# Patient Record
Sex: Male | Born: 1955 | Race: White | Hispanic: No | Marital: Married | Smoking: Never smoker
Health system: Southern US, Community
[De-identification: ages and names within clinical notes are randomized; demographics above are authoritative.]

## PROBLEM LIST (undated history)

## (undated) DIAGNOSIS — K580 Irritable bowel syndrome with diarrhea: Principal | ICD-10-CM

## (undated) DIAGNOSIS — F3340 Major depressive disorder, recurrent, in remission, unspecified: Secondary | ICD-10-CM

## (undated) DIAGNOSIS — E782 Mixed hyperlipidemia: Secondary | ICD-10-CM

## (undated) DIAGNOSIS — E785 Hyperlipidemia, unspecified: Secondary | ICD-10-CM

## (undated) DIAGNOSIS — N410 Acute prostatitis: Secondary | ICD-10-CM

## (undated) DIAGNOSIS — N401 Enlarged prostate with lower urinary tract symptoms: Secondary | ICD-10-CM

## (undated) DIAGNOSIS — Z79899 Other long term (current) drug therapy: Principal | ICD-10-CM

## (undated) DIAGNOSIS — R350 Frequency of micturition: Secondary | ICD-10-CM

## (undated) DIAGNOSIS — I2510 Atherosclerotic heart disease of native coronary artery without angina pectoris: Principal | ICD-10-CM

## (undated) DIAGNOSIS — M109 Gout, unspecified: Secondary | ICD-10-CM

## (undated) DIAGNOSIS — I493 Ventricular premature depolarization: Secondary | ICD-10-CM

## (undated) HISTORY — DX: Hyperlipidemia, unspecified: E78.5

## (undated) HISTORY — DX: Ventricular premature depolarization: I49.3

## (undated) HISTORY — PX: MOHS SURGERY: SUR867

## (undated) HISTORY — DX: Atherosclerotic heart disease of native coronary artery without angina pectoris: I25.10

---

## 2011-07-31 ENCOUNTER — Emergency Department (HOSPITAL_COMMUNITY): Payer: PRIVATE HEALTH INSURANCE

## 2011-07-31 ENCOUNTER — Emergency Department (HOSPITAL_COMMUNITY)
Admission: EM | Admit: 2011-07-31 | Discharge: 2011-07-31 | Disposition: A | Discharge status: Home / Self Care | Payer: PRIVATE HEALTH INSURANCE | Attending: Emergency Medicine | Admitting: Emergency Medicine

## 2011-07-31 DIAGNOSIS — R002 Palpitations: Secondary | ICD-10-CM | POA: Insufficient documentation

## 2011-07-31 DIAGNOSIS — Z79899 Other long term (current) drug therapy: Secondary | ICD-10-CM | POA: Insufficient documentation

## 2011-07-31 DIAGNOSIS — I4949 Other premature depolarization: Secondary | ICD-10-CM | POA: Insufficient documentation

## 2011-07-31 DIAGNOSIS — R030 Elevated blood-pressure reading, without diagnosis of hypertension: Secondary | ICD-10-CM | POA: Insufficient documentation

## 2011-07-31 DIAGNOSIS — E7800 Pure hypercholesterolemia, unspecified: Secondary | ICD-10-CM | POA: Insufficient documentation

## 2011-07-31 LAB — COMPREHENSIVE METABOLIC PANEL
BUN: 18 mg/dL (ref 6–23)
CO2: 28 mEq/L (ref 19–32)
Chloride: 100 mEq/L (ref 96–112)
Creatinine, Ser: 1.25 mg/dL (ref 0.50–1.35)
GFR calc non Af Amer: 65 mL/min — ABNORMAL LOW (ref >90)
Total Bilirubin: 0.3 mg/dL (ref 0.3–1.2)

## 2011-07-31 LAB — POCT I-STAT TROPONIN I: Troponin i, poc: 0 ng/mL (ref 0.00–0.08)

## 2011-07-31 LAB — CBC
HCT: 43.2 % (ref 39.0–52.0)
Hemoglobin: 15.2 g/dL (ref 13.0–17.0)
MCH: 32.5 pg (ref 26.0–34.0)
MCHC: 35.2 g/dL (ref 30.0–36.0)
RDW: 12.9 % (ref 11.5–15.5)

## 2013-06-22 DIAGNOSIS — I2510 Atherosclerotic heart disease of native coronary artery without angina pectoris: Secondary | ICD-10-CM | POA: Insufficient documentation

## 2013-06-22 HISTORY — PX: CORONARY ANGIOPLASTY WITH STENT PLACEMENT: SHX49

## 2013-06-22 HISTORY — PX: CARDIAC CATHETERIZATION: SHX172

## 2013-06-22 HISTORY — DX: Atherosclerotic heart disease of native coronary artery without angina pectoris: I25.10

## 2015-12-07 ENCOUNTER — Encounter: Payer: Self-pay | Admitting: Internal Medicine

## 2015-12-07 ENCOUNTER — Ambulatory Visit (INDEPENDENT_AMBULATORY_CARE_PROVIDER_SITE_OTHER): Payer: PRIVATE HEALTH INSURANCE | Admitting: Internal Medicine

## 2015-12-07 VITALS — BP 120/82 | HR 64 | Ht 72.0 in | Wt 199.0 lb

## 2015-12-07 DIAGNOSIS — I493 Ventricular premature depolarization: Secondary | ICD-10-CM | POA: Diagnosis not present

## 2015-12-07 DIAGNOSIS — M542 Cervicalgia: Secondary | ICD-10-CM

## 2015-12-07 DIAGNOSIS — I2510 Atherosclerotic heart disease of native coronary artery without angina pectoris: Secondary | ICD-10-CM

## 2015-12-07 DIAGNOSIS — R0789 Other chest pain: Secondary | ICD-10-CM

## 2015-12-07 NOTE — Progress Notes (Signed)
ELECTROPHYSIOLOGY CONSULT NOTE  Patient ID: Paul Garner, MRN: [id number], DOB/AGE: 58-Feb-1957 58 y.o. Admit date: (Not on file) Date of Consult: 12/07/2015  Primary Physician: No primary care provider on file. Primary Cardiologist: [facility]ician non  Chief Complaint: establish   HPI Paul Garner is a 60 y.o. male  Is seen to establish cardiac care.  He has 2 specific issues. The first is coronary artery disease for which she underwent stenting 2014 when he presented with discomfort in his neck. Exertional back discomfort has not been seen since.  Notably, he also has a history of dyslipidemia; this includes hypertriglyceridemia. He has been managed with a combination of Crestor and 70 at, the latter of which she has discontinued because of going to the bathroom.  He has arthritis and discontinued antiplatelet therapy in favor of diclofenac  He also has a history of PVCs. These are variably present; they seem to be worse with exertion. They are associated with a fair amount of anxiety although not a lot of symptoms. He notes them more frequently when he is stressed or exerting himself.    Past Medical History  Diagnosis Date  . Hyperlipidemia   . PVC's (premature ventricular contractions)   . Coronary artery disease 06/22/2013    Stent placement to the Distal RCA & the mid RCA      Surgical History:  Past Surgical History  Procedure Laterality Date  . Cardiac catheterization  06/22/2013  . Coronary angioplasty with stent placement  06/22/2013    stent placement in the distal RCA & mid RCA     Home Meds: Prior to Admission medications   Medication Sig Start Date End Date Taking? Authorizing Provider  diclofenac (VOLTAREN) 75 MG EC tablet Take 75 mg by mouth 2 (two) times daily.    Yes Historical Provider, MD  rosuvastatin (CRESTOR) 20 MG tablet Take 20 mg by mouth daily.    Yes Historical Provider, MD  ULORIC 80 MG TABS 80 mg daily.  10/08/15   Yes Historical Provider, MD    Allergies: No Known Allergies  Social History   Social History  . Marital Status: Married    Spouse Name: N/A  . Number of Children: N/A  . Years of Education: N/A   Occupational History  . Not on file.   Social History Main Topics  . Smoking status: Never Smoker   . Smokeless tobacco: Not on file  . Alcohol Use: 1.2 oz/week    1 Glasses of wine, 1 Cans of beer per week  . Drug Use: No  . Sexual Activity: Not on file   Other Topics Concern  . Not on file   Social History Narrative  . No narrative on file     Family History  Problem Relation Age of Onset  . Heart disease Father     CABG x 2   . Heart attack Father 96  . Hyperlipidemia Father       ROS:  Please see the history of present illness.    All other systems reviewed and negative.    Physical Exam   Blood pressure 120/82, pulse 64, height 6' (1.829 m), weight 199 lb (90.266 kg). General: Well developed, well nourished male in no acute distress. Head: Normocephalic, atraumatic, sclera non-icteric, no xanthomas, nares are without discharge. EENT: normal  Lymph Nodes:  none Neck: Negative for carotid bruits. JVD not elevated. Back:without scoliosis kyphosis*  Lungs: Clear bilaterally to auscultation without wheezes, rales,  or rhonchi. Breathing is unlabored. Heart: RRR with S1 S2. No * murmur . No rubs, or gallops appreciated. Abdomen: Soft, non-tender, non-distended with normoactive bowel sounds. No hepatomegaly. No rebound/guarding. No obvious abdominal masses. Msk:  Strength and tone appear normal for age. Extremities: No clubbing or cyanosis. No  * edema.  Distal pedal pulses are 2+ and equal bilaterally. Skin: Warm and Dry Neuro: Alert and oriented X 3. CN III-XII intact Grossly normal sensory and motor function . Psych:  Responds to questions appropriately with a normal affect.      Labs: Cardiac Enzymes No results for input(s): CKTOTAL, CKMB, TROPONINI in the  last 72 hours. CBC Lab Results  Component Value Date   WBC 6.2 07/31/2011   HGB 15.2 07/31/2011   HCT 43.2 07/31/2011   MCV 92.3 07/31/2011   PLT 273 07/31/2011   PROTIME: No results for input(s): LABPROT, INR in the last 72 hours. Chemistry No results for input(s): NA, K, CL, CO2, BUN, CREATININE, CALCIUM, PROT, BILITOT, ALKPHOS, ALT, AST, GLUCOSE in the last 168 hours.  Invalid input(s): LABALBU Lipids No results found for: CHOL, HDL, LDLCALC, TRIG BNP No results found for: PROBNP Thyroid Function Tests: No results for input(s): TSH, T4TOTAL, T3FREE, THYROIDAB in the last 72 hours.  Invalid input(s): FREET3 Miscellaneous No results found for: DDIMER  Radiology/Studies:  No results found.  EKG nsr at 64 Intervals 14/08/39  Assessment and Plan:  Ischemic heart disease with prior stenting DES RCA 2  Arthritis on diclofenac  Dyslipidemia on Crestor  PVC  Neck discomfort  He is having some neck discomfort. While it is somewhat dissimilar from his angina, we will undertake stress testing. Also thinks that his PVCs are worse with exercise, this will give [place] an opportunity to assess PVC burden response to exercise.  We will also undertake a 24/48 hour Holter monitor to quantitate PVCs to assess potential for negative impact on heart performance    . A couple of issues are raised by his medical regime. The first is the absence of antiplatelet therapy apart from his diclofenac. The second is the cox2 preference of his NSAID. The third is the appropriateness of his anti-lipid therapy. The fact that he was previously on Zetia and is only on modest dose Crestor suggests Augmented therapy may be of benefit. With his history of hypertriglyceridemia, we will get a fasting lipid profile.             Virl Axe

## 2015-12-07 NOTE — Patient Instructions (Addendum)
Medication Instructions: 1) Start aspirin 81 mg one tablet by mouth once daily  Labwork: - Your physician recommends that you return for a FASTING lipid profile: either the day of your stress test (drawn at hospital lab).  Procedures/Testing: - Your physician has recommended that you wear a 48 hour holter monitor. Holter monitors are medical devices that record the heart's electrical activity. Doctors most often use these monitors to diagnose arrhythmias. Arrhythmias are problems with the speed or rhythm of the heartbeat. The monitor is a small, portable device. You can wear one while you do your normal daily activities. This is usually used to diagnose what is causing palpitations/syncope (passing out).  - Your physician has requested that you have an exercise stress myoview. For further information please visit [email or web address]. Please follow instruction sheet, as given.  [place]  Your caregiver has ordered a Stress Test with nuclear imaging. The purpose of this test is to evaluate the blood supply to your heart muscle. This procedure is referred to as a "Non-Invasive Stress Test." This is because other than having an IV started in your vein, nothing is inserted or "invades" your body. Cardiac stress tests are done to find areas of poor blood flow to the heart by determining the extent of coronary artery disease (CAD). Some patients exercise on a treadmill, which naturally increases the blood flow to your heart, while others who are  unable to walk on a treadmill due to physical limitations have a pharmacologic/chemical stress agent called Lexiscan . This medicine will mimic walking on a treadmill by temporarily increasing your coronary blood flow.   Please note: these test may take anywhere between 2-4 hours to complete  PLEASE REPORT TO [facility] AT THE FIRST DESK WILL DIRECT YOU WHERE TO GO  Date of Procedure:_______Tuesday  2/14/17______________________________  Arrival Time for Procedure:__________7:45 am____________________  Instructions regarding medication:   ____ : Hold diabetes medication morning of procedure  ____:  Hold betablocker(s) night before procedure and morning of procedure  ____:  Hold other medications as follows:  _x___: You may take all of your medications the morning of your procedure with enough water to get them down safely   PLEASE NOTIFY THE OFFICE AT LEAST 24 HOURS IN ADVANCE IF YOU ARE UNABLE TO [place].  [phone] AND  PLEASE NOTIFY NUCLEAR MEDICINE AT [facility] AT LEAST 24 HOURS IN ADVANCE IF YOU ARE UNABLE TO KEEP YOUR APPOINTMENT. [phone]  How to prepare for your Myoview test:  1. Do not eat or drink after midnight 2. No caffeine for 24 hours prior to test 3. No smoking 24 hours prior to test. 4. Your medication may be taken with water.  If your doctor stopped a medication because of this test, do not take that medication. 5. Ladies, please do not wear dresses.  Skirts or pants are appropriate. Please wear a short sleeve shirt. 6. No perfume, cologne or lotion. 7. Wear comfortable walking shoes. No heels!   Follow-Up: - Your physician recommends that you schedule a follow-up appointment in: 12 weeks with Christell Faith, PA.  - Your physician wants you to follow-up in: 6 months with Dr. Fletcher Anon (establish general cardiology care). You will receive a reminder letter in the mail two months in advance. If you don't receive a letter, please call our office to schedule the follow-up appointment.  Any Additional Special Instructions Will Be Listed Below (If Applicable).     If you need a refill on your  cardiac medications before your next appointment, please call your pharmacy.

## 2015-12-12 ENCOUNTER — Other Ambulatory Visit
Admission: RE | Admit: 2015-12-12 | Discharge: 2015-12-12 | Disposition: A | Discharge status: Home / Self Care | Payer: 59 | Source: Ambulatory Visit | Attending: Internal Medicine | Admitting: Internal Medicine

## 2015-12-12 ENCOUNTER — Encounter
Admission: RE | Admit: 2015-12-12 | Discharge: 2015-12-12 | Disposition: A | Discharge status: Home / Self Care | Payer: 59 | Source: Ambulatory Visit | Attending: Internal Medicine | Admitting: Internal Medicine

## 2015-12-12 DIAGNOSIS — M542 Cervicalgia: Secondary | ICD-10-CM | POA: Insufficient documentation

## 2015-12-12 DIAGNOSIS — R0789 Other chest pain: Secondary | ICD-10-CM | POA: Diagnosis present

## 2015-12-12 DIAGNOSIS — I2510 Atherosclerotic heart disease of native coronary artery without angina pectoris: Secondary | ICD-10-CM | POA: Diagnosis present

## 2015-12-12 LAB — NM MYOCAR MULTI W/SPECT W/WALL MOTION / EF
CHL CUP NUCLEAR SDS: 0
CHL CUP NUCLEAR SRS: 1
CHL CUP NUCLEAR SSS: 0
CSEPEW: 10.1 METS
CSEPHR: 85 %
Exercise duration (min): 10 min
LV sys vol: 33 mL
LVDIAVOL: 82 mL
Peak HR: 139 {beats}/min
Rest HR: 69 {beats}/min
TID: 0.8

## 2015-12-12 LAB — LIPID PANEL
Cholesterol: 137 mg/dL (ref 0–200)
HDL: 41 mg/dL (ref >40)
LDL Cholesterol: 67 mg/dL (ref 0–99)
Total CHOL/HDL Ratio: 3.3 RATIO
Triglycerides: 144 mg/dL (ref <150)
VLDL: 29 mg/dL (ref 0–40)

## 2015-12-12 MED ORDER — TECHNETIUM TC 99M SESTAMIBI - CARDIOLITE
14.1600 | Freq: Once | INTRAVENOUS | Status: AC | PRN
  Administered 2015-12-12: 14.16 via INTRAVENOUS

## 2015-12-12 MED ORDER — TECHNETIUM TC 99M SESTAMIBI - CARDIOLITE
32.0890 | Freq: Once | INTRAVENOUS | Status: AC | PRN
  Administered 2015-12-12: 32.089 via INTRAVENOUS

## 2015-12-18 ENCOUNTER — Ambulatory Visit (INDEPENDENT_AMBULATORY_CARE_PROVIDER_SITE_OTHER): Payer: 59

## 2015-12-18 DIAGNOSIS — I493 Ventricular premature depolarization: Secondary | ICD-10-CM

## 2015-12-21 ENCOUNTER — Other Ambulatory Visit: Payer: Self-pay | Admitting: Respiratory Therapy

## 2015-12-25 ENCOUNTER — Other Ambulatory Visit: Payer: Self-pay | Admitting: Internal Medicine

## 2015-12-25 ENCOUNTER — Ambulatory Visit
Admission: RE | Admit: 2015-12-25 | Discharge: 2015-12-25 | Disposition: A | Discharge status: Home / Self Care | Payer: 59 | Source: Ambulatory Visit | Attending: Internal Medicine | Admitting: Internal Medicine

## 2015-12-25 ENCOUNTER — Ambulatory Visit (HOSPITAL_COMMUNITY)
Admission: RE | Admit: 2015-12-25 | Discharge: 2015-12-25 | Disposition: A | Discharge status: Home / Self Care | Payer: 59 | Source: Ambulatory Visit | Attending: Internal Medicine | Admitting: Internal Medicine

## 2015-12-25 DIAGNOSIS — R0789 Other chest pain: Secondary | ICD-10-CM | POA: Insufficient documentation

## 2015-12-25 DIAGNOSIS — I493 Ventricular premature depolarization: Secondary | ICD-10-CM

## 2016-01-03 ENCOUNTER — Ambulatory Visit: Payer: PRIVATE HEALTH INSURANCE | Admitting: Cardiology

## 2016-03-07 ENCOUNTER — Ambulatory Visit (INDEPENDENT_AMBULATORY_CARE_PROVIDER_SITE_OTHER): Payer: 59 | Admitting: Cardiovascular Disease

## 2016-03-07 ENCOUNTER — Encounter: Payer: Self-pay | Admitting: Cardiovascular Disease

## 2016-03-07 VITALS — BP 110/64 | HR 81 | Ht 72.0 in | Wt 197.8 lb

## 2016-03-07 DIAGNOSIS — E785 Hyperlipidemia, unspecified: Secondary | ICD-10-CM

## 2016-03-07 DIAGNOSIS — I493 Ventricular premature depolarization: Secondary | ICD-10-CM | POA: Insufficient documentation

## 2016-03-07 MED ORDER — ROSUVASTATIN CALCIUM 20 MG PO TABS: 20.0000 mg | ORAL_TABLET | Freq: Every day | ORAL | Status: DC

## 2016-03-07 NOTE — Patient Instructions (Addendum)
Medication Instructions:  Your physician recommends that you continue on your current medications as directed. Please refer to the Current Medication list given to you today.   Labwork: Fasting lipid and liver at your 6 months office visit.   Testing/Procedures: none  Follow-Up: Your physician wants you to follow-up in: six months with Dr. Fletcher Anon.  You will receive a reminder letter in the mail two months in advance. If you don't receive a letter, please call our office to schedule the follow-up appointment.   Any Other Special Instructions Will Be Listed Below (If Applicable).     If you need a refill on your cardiac medications before your next appointment, please call your pharmacy.

## 2016-03-07 NOTE — Progress Notes (Signed)
Cardiology Office Note   Date:  03/07/2016   ID:  Paul Garner, DOB [id number], MRN [id number]  PCP:  No PCP Per Patient  Cardiologist:   Kathlyn Sacramento, MD   Chief Complaint  Patient presents with  . other    Per [name]. general cardiologist. Meds reviewed verbally.      History of Present Illness: Paul Garner is a 60 y.o. male who is here today to establish cardiovascular care. He is in the process of moving from [facility]. He has known history of coronary artery disease with previous stenting of the right coronary artery in 2014 for stable angina. No previous myocardial infarction. Other medical problems include PVCs, hyperlipidemia and gout. He is a lifelong nonsmoker. He has extensive family history of coronary artery disease. He was seen by Dr. Caryl Comes for PVCs. He had a nuclear stress test which showed no evidence of ischemia with normal ejection fraction. Holter monitor showed 1700 PVCs representing 1%. He has been doing well overall with no recurrent chest pain or shortness of breath. He occasionally gets PVCs which are completely unpredictable. They do not seem to be related to stress or caffeine intake.    Past Medical History  Diagnosis Date  . PVC's (premature ventricular contractions)   . Coronary artery disease 06/22/2013    Stent placement to the Distal RCA & the mid RCA  . Hyperlipidemia     Past Surgical History  Procedure Laterality Date  . Cardiac catheterization  06/22/2013  . Coronary angioplasty with stent placement  06/22/2013    stent placement in the distal RCA & mid RCA     Current Outpatient Prescriptions  Medication Sig Dispense Refill  . aspirin EC 81 MG tablet Take 1 tablet (81 mg total) by mouth daily.    . diclofenac (VOLTAREN) 75 MG EC tablet Take 75 mg by mouth 2 (two) times daily.     . rosuvastatin (CRESTOR) 20 MG tablet Take 1 tablet (20 mg total) by mouth daily. 30 tablet 6  . ULORIC 80 MG TABS 80 mg  daily.      No current facility-administered medications for this visit.    Allergies:   Review of patient's allergies indicates no known allergies.    Social History:  The patient  reports that he has never smoked. He does not have any smokeless tobacco history on file. He reports that he drinks about 1.2 oz of alcohol per week. He reports that he does not use illicit drugs.   Family History:  The patient's family history includes Heart attack (age of onset: 61) in his father; Heart disease in his father; Hyperlipidemia in his father.    ROS:  Please see the history of present illness.   Otherwise, review of systems are positive for none.   All other systems are reviewed and negative.    PHYSICAL EXAM: VS:  BP 110/64 mmHg  Pulse 81  Ht 6' (1.829 m)  Wt 197 lb 12 oz (89.699 kg)  BMI 26.81 kg/m2 , BMI Body mass index is 26.81 kg/(m^2). GEN: Well nourished, well developed, in no acute distress HEENT: normal Neck: no JVD, carotid bruits, or masses Cardiac: RRR; no murmurs, rubs, or gallops,no edema  Respiratory:  clear to auscultation bilaterally, normal work of breathing GI: soft, nontender, nondistended, + BS MS: no deformity or atrophy Skin: warm and dry, no rash Neuro:  Strength and sensation are intact Psych: euthymic mood, full affect   EKG:  EKG is not ordered today.   Recent Labs: No results found for requested labs within last 365 days.    Lipid Panel    Component Value Date/Time   CHOL 137 12/12/2015 1007   TRIG 144 12/12/2015 1007   HDL 41 12/12/2015 1007   CHOLHDL 3.3 12/12/2015 1007   VLDL 29 12/12/2015 1007   LDLCALC 67 12/12/2015 1007      Wt Readings from Last 3 Encounters:  03/07/16 197 lb 12 oz (89.699 kg)  12/07/15 199 lb (90.266 kg)        ASSESSMENT AND PLAN:  1.  Coronary artery disease involving native coronary arteries without angina: He is overall doing well with no anginal symptoms. Continue low-dose aspirin. Drug-eluting stent  placement was more than 2 years ago and thus there is no need for dual antiplatelet therapy.  2. Hyperlipidemia: Lipid profile recently was excellent but the patient does not think it was accurate as his cholesterol and triglycerides are usually much higher even on rosuvastatin. I'm planning to repeat in 6 months.  3. PVCs: The burden was not significant enough to require treatment. Continue to monitor symptoms.    Disposition:   FU with me in 6 months  Signed,  Kathlyn Sacramento, MD  03/07/2016 4:20 PM    [facility]

## 2016-03-31 ENCOUNTER — Encounter: Payer: Self-pay | Admitting: *Deleted

## 2016-03-31 ENCOUNTER — Ambulatory Visit
Admission: EM | Admit: 2016-03-31 | Discharge: 2016-03-31 | Disposition: A | Discharge status: Home / Self Care | Payer: 59 | Attending: Family Medicine | Admitting: Family Medicine

## 2016-03-31 DIAGNOSIS — K648 Other hemorrhoids: Secondary | ICD-10-CM | POA: Diagnosis not present

## 2016-03-31 MED ORDER — LIDOCAINE-HYDROCORTISONE ACE 3-1 % RE KIT: 1.0000 "application " | PACK | Freq: Two times a day (BID) | RECTAL | Status: DC

## 2016-03-31 NOTE — ED Provider Notes (Signed)
Discussed with patient's wife and having some low-grade fever and chills since Friday which she did not mention when he was earlier today. Will call in Septra DS 1 tablet twice a day #20 at [facility]. His wife Paul Garner was notified that the medicine will be called in.  Frederich Cha, MD 03/31/16 1725

## 2016-03-31 NOTE — Discharge Instructions (Signed)
You are overdue for colonoscopy-Please schedule ASAP Return if fever, severe pain or bleeding Coloace 100mg  1-2 daily for stool softener  High-Fiber Diet Fiber, also called dietary fiber, is a type of carbohydrate found in fruits, vegetables, whole grains, and beans. A high-fiber diet can have many health benefits. Your health care provider may recommend a high-fiber diet to help:  Prevent constipation. Fiber can make your bowel movements more regular.  Lower your cholesterol.  Relieve hemorrhoids, uncomplicated diverticulosis, or irritable bowel syndrome.  Prevent overeating as part of a weight-loss plan.  Prevent heart disease, type 2 diabetes, and certain cancers. WHAT IS MY PLAN? The recommended daily intake of fiber includes:  38 grams for men under age 17.  7 grams for men over age 32.  69 grams for women under age 42.  13 grams for women over age 48. You can get the recommended daily intake of dietary fiber by eating a variety of fruits, vegetables, grains, and beans. Your health care provider may also recommend a fiber supplement if it is not possible to get enough fiber through your diet. WHAT DO I NEED TO KNOW ABOUT A HIGH-FIBER DIET?  Fiber supplements have not been widely studied for their effectiveness, so it is better to get fiber through food sources.  Always check the fiber content on thenutrition facts label of any prepackaged food. Look for foods that contain at least 5 grams of fiber per serving.  Ask your dietitian if you have questions about specific foods that are related to your condition, especially if those foods are not listed in the following section.  Increase your daily fiber consumption gradually. Increasing your intake of dietary fiber too quickly may cause bloating, cramping, or gas.  Drink plenty of water. Water helps you to digest fiber. WHAT FOODS CAN I EAT? Grains Whole-grain breads. Multigrain cereal. Oats and oatmeal. Brown rice. Barley.  Bulgur wheat. [place]. Bran muffins. Popcorn. Rye wafer crackers. Vegetables Sweet potatoes. Spinach. Kale. Artichokes. Cabbage. Broccoli. Green peas. Carrots. Squash. Fruits Berries. Pears. Apples. Oranges. Avocados. Prunes and raisins. Dried figs. Meats and Other Protein Sources [place], kidney, pinto, and soy beans. Split peas. Lentils. Nuts and seeds. Dairy Fiber-fortified yogurt. Beverages Fiber-fortified soy milk. Fiber-fortified orange juice. Other Fiber bars. The items listed above may not be a complete list of recommended foods or beverages. Contact your dietitian for more options. WHAT FOODS ARE NOT RECOMMENDED? Grains White bread. Pasta made with refined flour. White rice. Vegetables Fried potatoes. Canned vegetables. Well-cooked vegetables.  Fruits Fruit juice. Cooked, strained fruit. Meats and Other Protein Sources Fatty cuts of meat. Fried Sales executive or fried fish. Dairy Milk. Yogurt. Cream cheese. Sour cream. Beverages Soft drinks. Other Cakes and pastries. Butter and oils. The items listed above may not be a complete list of foods and beverages to avoid. Contact your dietitian for more information. WHAT ARE SOME TIPS FOR INCLUDING HIGH-FIBER FOODS IN MY DIET?  Eat a wide variety of high-fiber foods.  Make sure that half of all grains consumed each day are whole grains.  Replace breads and cereals made from refined flour or white flour with whole-grain breads and cereals.  Replace white rice with brown rice, bulgur wheat, or millet.  Start the day with a breakfast that is high in fiber, such as a cereal that contains at least 5 grams of fiber per serving.  Use beans in place of meat in soups, salads, or pasta.  Eat high-fiber snacks, such as berries, raw vegetables, nuts, or popcorn.  This information is not intended to replace advice given to you by your health care provider. Make sure you discuss any questions you have with your health care provider.     Document Released: 10/14/2005 Document Revised: 11/04/2014 Document Reviewed: 03/29/2014 Elsevier Interactive Patient Education 2016 [employer].  Hemorrhoids Hemorrhoids are puffy (swollen) veins around the rectum or anus. Hemorrhoids can cause pain, itching, bleeding, or irritation. HOME CARE  Eat foods with fiber, such as whole grains, beans, nuts, fruits, and vegetables. Ask your doctor about taking products with added fiber in them (fibersupplements).  Drink enough fluid to keep your pee (urine) clear or pale yellow.  Exercise often.  Go to the bathroom when you have the urge to poop. Do not wait.  Avoid straining to poop (bowel movement).  Keep the butt area dry and clean. Use wet toilet paper or moist paper towels.  Medicated creams and medicine inserted into the anus (anal suppository) may be used or applied as told.  Only take medicine as told by your doctor.  Take a warm water bath (sitz bath) for 15-20 minutes to ease pain. Do this 3-4 times a day.  Place ice packs on the area if it is tender or puffy. Use the ice packs between the warm water baths.  Put ice in a plastic bag.  Place a towel between your skin and the bag.  Leave the ice on for 15-20 minutes, 03-04 times a day.  Do not use a donut-shaped pillow or sit on the toilet for a long time. GET HELP RIGHT AWAY IF:   You have more pain that is not controlled by treatment or medicine.  You have bleeding that will not stop.  You have trouble or are unable to poop (bowel movement).  You have pain or puffiness outside the area of the hemorrhoids. MAKE SURE YOU:   Understand these instructions.  Will watch your condition.  Will get help right away if you are not doing well or get worse.   This information is not intended to replace advice given to you by your health care provider. Make sure you discuss any questions you have with your health care provider.   Document Released: 07/23/2008 Document  Revised: 09/30/2012 Document Reviewed: 08/25/2012 Elsevier Interactive Patient Education [employer].

## 2016-03-31 NOTE — ED Provider Notes (Signed)
CSN: [id number]     Arrival date & time 03/31/16  0865 History   First MD Initiated Contact with Patient 03/31/16 [phone]     Chief Complaint  Patient presents with  . Hemorrhoids   HPI Paul Garner is a pleasant 60 y.o. male who presents with anal pain. He states about 3 days ago he noted left-sided anal pain. Pain is 4 out of 10 at worst. He feels he has hemorrhoids and has noticed bulging in that area. He has been trying Preparation H cream and suppositories. He has had little relief with this. He denies any history of constipation diarrhea or straining. 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 His last colonoscopy was over 10 years ago. He denies rectal bleeding. He denies any fever. He is on RN and aspirin daily. He denies any significant hemorrhoid surgeries in the past or treatments but has felt like he has had intermittent hemorrhoids over the years.  Past Medical History  Diagnosis Date  . PVC's (premature ventricular contractions)   . Coronary artery disease 06/22/2013    Stent placement to the Distal RCA & the mid RCA  . Hyperlipidemia    Past Surgical History  Procedure Laterality Date  . Cardiac catheterization  06/22/2013  . Coronary angioplasty with stent placement  06/22/2013    stent placement in the distal RCA & mid RCA   Family History  Problem Relation Age of Onset  . Heart disease Father     CABG x 2   . Heart attack Father 49  . Hyperlipidemia Father    Social History  Substance Use Topics  . Smoking status: Never Smoker   . Smokeless tobacco: None  . Alcohol Use: 1.2 oz/week    1 Glasses of wine, 1 Cans of beer per week    Review of Systems  Constitutional: Negative.   HENT: Negative.   Eyes: Negative.   Respiratory: Negative.   Cardiovascular: Negative.   Gastrointestinal: Positive for rectal pain. Negative for blood in stool and anal bleeding.  Genitourinary: Negative.    Musculoskeletal: Negative.   Skin: Negative.   Neurological: Negative.   Hematological: Negative.   Psychiatric/Behavioral: Negative.     Allergies  Review of patient's allergies indicates no known allergies.  Home Medications   Prior to Admission medications   Medication Sig Start Date End Date Taking? Authorizing Provider  aspirin EC 81 MG tablet Take 1 tablet (81 mg total) by mouth daily. 12/07/15  Yes Deboraha Sprang, MD  diclofenac (VOLTAREN) 75 MG EC tablet Take 75 mg by mouth 2 (two) times daily.    Yes Historical Provider, MD  rosuvastatin (CRESTOR) 20 MG tablet Take 1 tablet (20 mg total) by mouth daily. 03/07/16  Yes Wellington Hampshire, MD  ULORIC 80 MG TABS 80 mg daily.  10/08/15  Yes Historical Provider, MD  lidocaine-hydrocortisone (ANAMANTLE) 3-1 % KIT Place 1 application rectally 2 (two) times daily. 03/31/16   Andria Meuse, NP   Meds Ordered and Administered this Visit  Medications - No data to display  BP 115/76 mmHg  Pulse 65  Temp(Src) 97.9 F (36.6 C) (Oral)  Resp 18  Ht 6' (1.829 m)  Wt 195 lb (88.451 kg)  BMI 26.44 kg/m2  SpO2 100% No data found.   Physical Exam  Constitutional: He is oriented to person, place, and time. He appears well-developed and well-nourished. No distress.  HENT:  Head: Normocephalic and atraumatic.  Eyes: Conjunctivae are normal. No scleral icterus.  Neck: Normal range of motion.  Cardiovascular: Normal rate and regular rhythm.   Pulmonary/Chest: Effort normal and breath sounds normal. No respiratory distress.  Abdominal: Soft. Bowel sounds are normal. He exhibits no distension. There is no hepatosplenomegaly. There is no tenderness. No hernia.  Genitourinary: Prostate normal. Rectal exam shows internal hemorrhoid and tenderness.  Inflamed int hemorrhoid 7-8 o clock.  No gross bleeding.  Musculoskeletal: Normal range of motion. He exhibits no edema or tenderness.  Neurological: He is alert and oriented to person, place, and  time. No cranial nerve deficit.  Skin: Skin is warm and dry. No rash noted. No erythema.  Psychiatric: He has a normal mood and affect. His behavior is normal. Judgment normal.  Nursing note and vitals reviewed.   ED Course  Procedures NA  Labs Review Labs Reviewed - No data to display  MDM   1. Inflamed internal hemorrhoid    Plan: Diagnosis reviewed with patient  Rx as per orders;  benefits, risks, potential side effects reviewed  Recommend supportive treatment with donut, high fiber diet, colace 118m qd or bid prn Seek additional medical care if symptoms worsen or are not improving Call Dr WAllen Norristo make appt for colonoscopy     KAndria Meuse NP 03/31/16 1002

## 2016-03-31 NOTE — ED Notes (Signed)
Patient started having symptoms of minor hemorrhoid pain 1 week ago, and started having severe hemorrhoid pain 2 days ago. Patient does have a history of minor hemorrhoid pain.

## 2016-04-01 ENCOUNTER — Encounter: Payer: Self-pay | Admitting: Emergency Medicine

## 2016-04-01 ENCOUNTER — Encounter
Admission: EM | Disposition: A | Discharge status: Home / Self Care | Payer: Self-pay | Source: Home / Self Care | Attending: Emergency Medicine

## 2016-04-01 ENCOUNTER — Telehealth: Payer: Self-pay

## 2016-04-01 ENCOUNTER — Emergency Department
Admission: EM | Admit: 2016-04-01 | Discharge: 2016-04-01 | Disposition: A | Discharge status: Home / Self Care | Payer: 59 | Attending: Emergency Medicine | Admitting: Emergency Medicine

## 2016-04-01 ENCOUNTER — Emergency Department: Payer: 59 | Admitting: Anesthesiology

## 2016-04-01 DIAGNOSIS — I493 Ventricular premature depolarization: Secondary | ICD-10-CM | POA: Diagnosis not present

## 2016-04-01 DIAGNOSIS — K611 Rectal abscess: Secondary | ICD-10-CM | POA: Insufficient documentation

## 2016-04-01 DIAGNOSIS — Z8249 Family history of ischemic heart disease and other diseases of the circulatory system: Secondary | ICD-10-CM | POA: Insufficient documentation

## 2016-04-01 DIAGNOSIS — E785 Hyperlipidemia, unspecified: Secondary | ICD-10-CM | POA: Insufficient documentation

## 2016-04-01 DIAGNOSIS — I2510 Atherosclerotic heart disease of native coronary artery without angina pectoris: Secondary | ICD-10-CM | POA: Insufficient documentation

## 2016-04-01 DIAGNOSIS — Z955 Presence of coronary angioplasty implant and graft: Secondary | ICD-10-CM | POA: Insufficient documentation

## 2016-04-01 HISTORY — PX: INCISION AND DRAINAGE PERIRECTAL ABSCESS: SHX1804

## 2016-04-01 LAB — CBC WITH DIFFERENTIAL/PLATELET
BASOS ABS: 0.1 10*3/uL (ref 0–0.1)
EOS ABS: 0 10*3/uL (ref 0–0.7)
HCT: 44.6 % (ref 40.0–52.0)
Hemoglobin: 15.1 g/dL (ref 13.0–18.0)
Lymphocytes Relative: 11 %
Lymphs Abs: 1 10*3/uL (ref 1.0–3.6)
MCH: 31 pg (ref 26.0–34.0)
MCHC: 33.7 g/dL (ref 32.0–36.0)
MCV: 91.8 fL (ref 80.0–100.0)
Monocytes Absolute: 0.6 10*3/uL (ref 0.2–1.0)
Monocytes Relative: 6 %
Neutro Abs: 7.8 10*3/uL — ABNORMAL HIGH (ref 1.4–6.5)
Neutrophils Relative %: 82 %
PLATELETS: 195 10*3/uL (ref 150–440)
RBC: 4.86 MIL/uL (ref 4.40–5.90)
RDW: 13.2 % (ref 11.5–14.5)
WBC: 9.6 10*3/uL (ref 3.8–10.6)

## 2016-04-01 SURGERY — INCISION AND DRAINAGE, ABSCESS, PERIRECTAL
Anesthesia: General | Wound class: Dirty or Infected

## 2016-04-01 MED ORDER — ONDANSETRON HCL 4 MG/2ML IJ SOLN
INTRAMUSCULAR | Status: DC | PRN
Start: 2016-04-01 — End: 2016-04-01
  Administered 2016-04-01: 4 mg via INTRAVENOUS

## 2016-04-01 MED ORDER — LACTATED RINGERS IV SOLN
INTRAVENOUS | Status: DC
  Administered 2016-04-01: 14:00:00 via INTRAVENOUS

## 2016-04-01 MED ORDER — ACETAMINOPHEN 10 MG/ML IV SOLN
INTRAVENOUS | Status: DC | PRN
  Administered 2016-04-01: 1000 mg via INTRAVENOUS

## 2016-04-01 MED ORDER — DOXYCYCLINE HYCLATE 100 MG PO CAPS: 100.0000 mg | ORAL_CAPSULE | Freq: Two times a day (BID) | ORAL | Status: DC

## 2016-04-01 MED ORDER — FENTANYL CITRATE (PF) 100 MCG/2ML IJ SOLN
25.0000 ug | INTRAMUSCULAR | Status: AC | PRN
  Administered 2016-04-01 (×6): 25 ug via INTRAVENOUS

## 2016-04-01 MED ORDER — KETAMINE HCL 50 MG/ML IJ SOLN
INTRAMUSCULAR | Status: DC | PRN
  Administered 2016-04-01: 30 mg via INTRAVENOUS

## 2016-04-01 MED ORDER — PROPOFOL 10 MG/ML IV BOLUS
INTRAVENOUS | Status: DC | PRN
  Administered 2016-04-01: 150 mg via INTRAVENOUS

## 2016-04-01 MED ORDER — FENTANYL CITRATE (PF) 100 MCG/2ML IJ SOLN
INTRAMUSCULAR | Status: AC
  Administered 2016-04-01: 25 ug via INTRAVENOUS
  Filled 2016-04-01: qty 2

## 2016-04-01 MED ORDER — OXYCODONE-ACETAMINOPHEN 5-325 MG PO TABS
1.0000 | ORAL_TABLET | ORAL | Status: DC | PRN
  Administered 2016-04-01: 1 via ORAL

## 2016-04-01 MED ORDER — GLYCOPYRROLATE 0.2 MG/ML IJ SOLN
INTRAMUSCULAR | Status: DC | PRN
  Administered 2016-04-01: 0.2 mg via INTRAVENOUS

## 2016-04-01 MED ORDER — OXYCODONE-ACETAMINOPHEN 5-325 MG PO TABS
ORAL_TABLET | ORAL | Status: AC
  Administered 2016-04-01: 1 via ORAL
  Filled 2016-04-01: qty 1

## 2016-04-01 MED ORDER — OXYCODONE-ACETAMINOPHEN 5-325 MG PO TABS: 1.0000 | ORAL_TABLET | ORAL | Status: DC | PRN

## 2016-04-01 MED ORDER — SODIUM CHLORIDE 0.9 % IV SOLN
1.0000 g | INTRAVENOUS | Status: AC
  Administered 2016-04-01: 1 g via INTRAVENOUS
  Filled 2016-04-01: qty 1

## 2016-04-01 MED ORDER — MIDAZOLAM HCL 2 MG/2ML IJ SOLN
INTRAMUSCULAR | Status: DC | PRN
  Administered 2016-04-01: 2 mg via INTRAVENOUS

## 2016-04-01 MED ORDER — FENTANYL CITRATE (PF) 100 MCG/2ML IJ SOLN
INTRAMUSCULAR | Status: DC | PRN
  Administered 2016-04-01: 25 ug via INTRAVENOUS
  Administered 2016-04-01: 50 ug via INTRAVENOUS

## 2016-04-01 MED ORDER — LIDOCAINE HCL (CARDIAC) 20 MG/ML IV SOLN
INTRAVENOUS | Status: DC | PRN
  Administered 2016-04-01: 100 mg via INTRAVENOUS

## 2016-04-01 MED ORDER — ONDANSETRON HCL 4 MG/2ML IJ SOLN: 4.0000 mg | Freq: Once | INTRAMUSCULAR | Status: DC | PRN

## 2016-04-01 SURGICAL SUPPLY — 32 items
BANDAGE ELASTIC 6 CLIP NS LF (GAUZE/BANDAGES/DRESSINGS) IMPLANT
BLADE SURG 15 STRL SS SAFETY (BLADE) ×3 IMPLANT
BNDG GAUZE 4.5X4.1 6PLY STRL (MISCELLANEOUS) IMPLANT
CANISTER SUCT 1200ML W/VALVE (MISCELLANEOUS) ×3 IMPLANT
CHLORAPREP W/TINT 26ML (MISCELLANEOUS) ×3 IMPLANT
CLOSURE WOUND 1/2 X4 (GAUZE/BANDAGES/DRESSINGS)
DRAIN PENROSE 1/4X12 LTX (DRAIN) ×3 IMPLANT
DRAPE LAPAROTOMY 100X77 ABD (DRAPES) ×3 IMPLANT
DRESSING TELFA 4X3 1S ST N-ADH (GAUZE/BANDAGES/DRESSINGS) IMPLANT
DRSG TEGADERM 4X4.75 (GAUZE/BANDAGES/DRESSINGS) IMPLANT
GLOVE BIO SURGEON STRL SZ7 (GLOVE) ×3 IMPLANT
GOWN STRL REUS W/ TWL LRG LVL3 (GOWN DISPOSABLE) ×2 IMPLANT
GOWN STRL REUS W/TWL LRG LVL3 (GOWN DISPOSABLE) ×4
KIT RM TURNOVER STRD PROC AR (KITS) ×3 IMPLANT
LABEL OR SOLS (LABEL) ×3 IMPLANT
NDL SAFETY 22GX1.5 (NEEDLE) ×3 IMPLANT
NDL SAFETY 25GX1.5 (NEEDLE) ×3 IMPLANT
NS IRRIG 1000ML POUR BTL (IV SOLUTION) ×3 IMPLANT
PACK BASIN MINOR ARMC (MISCELLANEOUS) ×3 IMPLANT
PAD GROUND ADULT SPLIT (MISCELLANEOUS) ×3 IMPLANT
SOL PREP PVP 2OZ (MISCELLANEOUS) ×3
SOLUTION PREP PVP 2OZ (MISCELLANEOUS) ×1 IMPLANT
STRIP CLOSURE SKIN 1/2X4 (GAUZE/BANDAGES/DRESSINGS) IMPLANT
SUT ETHILON 3-0 FS-10 30 BLK (SUTURE) ×3
SUT VIC AB 2-0 CT1 27 (SUTURE)
SUT VIC AB 2-0 CT1 TAPERPNT 27 (SUTURE) IMPLANT
SUT VIC AB 2-0 CT2 27 (SUTURE) IMPLANT
SUT VIC AB 4-0 FS2 27 (SUTURE) IMPLANT
SUTURE EHLN 3-0 FS-10 30 BLK (SUTURE) ×1 IMPLANT
SWAB CULTURE AMIES ANAERIB BLU (MISCELLANEOUS) ×3 IMPLANT
SWABSTK COMLB BENZOIN TINCTURE (MISCELLANEOUS) ×3 IMPLANT
SYR CONTROL 10ML (SYRINGE) ×3 IMPLANT

## 2016-04-01 NOTE — Op Note (Signed)
Preop diagnosis: Perirectal abscess  Post op diagnosis: Same located at the 2:00 location  Operation: Drainage of perirectal abscess  Surgeon: Mckinley Jewel   Assistant:     Anesthesia: Gen.  Complications: None  EBL: Less than 5 mL  Drains: None  Description: Patient was put to sleep with an LMA and then placed the lithotomy position on the operating table. Anal area was prepped and draped as sterile field and timeout performed.. Evaluation of the rectal area showed a abscess at the running alongside the rectum starting around the 2:00 location and going up approximately 5-6 cm. At the 2:00 location just outside the anal opening a cruciate incision was made and the skin excised along the corners to allow for adequate drainage. A Kelly clamp was introduced with a finger in the rectum alongside the rectal area to open up a large pocket of pus containing thick yellowish-white the pus with some odor. Approximately 10 mL to 15 mL pus was drained. Suction was used to aspirate out completely from the abscess cavity. The opening was adequate for continued drainage. Area was covered with some ABDs and a mesh panty. Patient subsequently extubated and returned recovery room in stable condition.

## 2016-04-01 NOTE — Anesthesia Procedure Notes (Signed)
Procedure Name: LMA Insertion Date/Time: 04/01/2016 2:43 PM Performed by: Rosaria Ferries, Leldon Steege Pre-anesthesia Checklist: Patient identified, Emergency Drugs available, Suction available and Patient being monitored Patient Re-evaluated:Patient Re-evaluated prior to inductionOxygen Delivery Method: Circle system utilized Preoxygenation: Pre-oxygenation with 100% oxygen Intubation Type: IV induction LMA: LMA inserted LMA Size: 5.0 Number of attempts: 1 Tube secured with: Tape Dental Injury: Teeth and Oropharynx as per pre-operative assessment

## 2016-04-01 NOTE — Anesthesia Preprocedure Evaluation (Addendum)
Anesthesia Evaluation  Patient identified by MRN, date of birth, ID band Patient awake    Reviewed: Allergy & Precautions, NPO status , Patient's Chart, lab work & pertinent test results, reviewed documented beta blocker date and time   Airway Mallampati: II  TM Distance: >3 FB     Dental  (+) Chipped   Pulmonary    Pulmonary exam normal        Cardiovascular + CAD and + Cardiac Stents  Normal cardiovascular exam     Neuro/Psych    GI/Hepatic   Endo/Other    Renal/GU      Musculoskeletal   Abdominal Normal abdominal exam  (+)   Peds  Hematology   Anesthesia Other Findings Hx of PVCs. Holter done, PVCs not significant.  Reproductive/Obstetrics                           Anesthesia Physical Anesthesia Plan  ASA: III  Anesthesia Plan: General   Post-op Pain Management:    Induction: Intravenous  Airway Management Planned: LMA  Additional Equipment:   Intra-op Plan:   Post-operative Plan: Extubation in OR  Informed Consent: I have reviewed the patients History and Physical, chart, labs and discussed the procedure including the risks, benefits and alternatives for the proposed anesthesia with the patient or authorized representative who has indicated his/her understanding and acceptance.     Plan Discussed with: CRNA and Surgeon  Anesthesia Plan Comments:        Anesthesia Quick Evaluation

## 2016-04-01 NOTE — H&P (Signed)
Paul Garner is an 60 y.o. male.   Chief Complaint:pain in rectal area HPI: 60 yr old male presents with 3-4 day history of feeling poorly, low grade temp, increasing pain in left side of rectal area. No prior such problems. Pt has history of cornary stents, not on anticoagulation at present Past Medical History  Diagnosis Date  . PVC's (premature ventricular contractions)   . Coronary artery disease 06/22/2013    Stent placement to the Distal RCA & the mid RCA  . Hyperlipidemia     Past Surgical History  Procedure Laterality Date  . Cardiac catheterization  06/22/2013  . Coronary angioplasty with stent placement  06/22/2013    stent placement in the distal RCA & mid RCA    Family History  Problem Relation Age of Onset  . Heart disease Father     CABG x 2   . Heart attack Father 48  . Hyperlipidemia Father    Social History:  reports that he has never smoked. He does not have any smokeless tobacco history on file. He reports that he drinks about 1.2 oz of alcohol per week. He reports that he does not use illicit drugs.  Allergies: No Known Allergies   (Not in a hospital admission)  No results found for this or any previous visit (from the past 48 hour(s)). No results found.  Review of Systems  Constitutional: Negative.   Respiratory: Negative.   Cardiovascular: Negative.   Gastrointestinal: Negative.   Genitourinary: Negative.     Blood pressure 127/78, pulse 73, temperature 98.4 F (36.9 C), temperature source Oral, resp. rate 18, height 6' (1.829 m), weight 195 lb (88.451 kg), SpO2 100 %. Physical Exam  Constitutional: He is oriented to person, place, and time. He appears well-developed and well-nourished.  Eyes: Conjunctivae are normal. No scleral icterus.  Neck: Neck supple.  Cardiovascular: Normal rate, regular rhythm and normal heart sounds.   Respiratory: Effort normal and breath sounds normal.  GI: Soft. Bowel sounds are normal. He exhibits no  distension. There is no tenderness.  Genitourinary: Rectal exam shows no external hemorrhoid and no fissure.     Lymphadenopathy:    He has no cervical adenopathy.  Neurological: He is alert and oriented to person, place, and time.  Skin: Skin is warm and dry.     Assessment/Plan Suspected perirectal abscess. Needs to be drained in OR- not suitable for local anesthetic. Pt advised fully and he is agreeable.  Christene Lye, MD 04/01/2016, 1:21 PM

## 2016-04-01 NOTE — ED Notes (Signed)
Rectal pain and swelling x 4 days, denies injury or bleeding.

## 2016-04-01 NOTE — Telephone Encounter (Signed)
Patient's wife called in and states that he was seen at [facility] yesterday for what Dr. Alveta Heimlich believes is a Peri-rectal abscess. Reports patient having a low grade fever of approximately 100.8 since Friday and has decreased appetite. Denies nausea and vomiting.   Spoke with Dr. Dahlia Byes at this time and he has advised to send patient to the Emergency Room.  Returned phone call to patient's wife and she verbalizes understanding of this.

## 2016-04-01 NOTE — Transfer of Care (Signed)
Immediate Anesthesia Transfer of Care Note  Patient: Paul Garner  Procedure(s) Performed: Procedure(s): IRRIGATION AND DEBRIDEMENT PERIRECTAL ABSCESS (N/A)  Patient Location: PACU  Anesthesia Type:General  Level of Consciousness: awake, alert , oriented and patient cooperative  Airway & Oxygen Therapy: Patient Spontanous Breathing and Patient connected to nasal cannula oxygen  Post-op Assessment: Report given to RN and Post -op Vital signs reviewed and stable  Post vital signs: Reviewed and stable  Last Vitals:  Filed Vitals:   04/01/16 1413 04/01/16 1519  BP: 137/82 137/86  Pulse: 69 85  Temp: 37.5 C 36.7 C  Resp: 16 12    Last Pain:  Filed Vitals:   04/01/16 1519  PainSc: 4          Complications: No apparent anesthesia complications

## 2016-04-01 NOTE — Anesthesia Postprocedure Evaluation (Signed)
Anesthesia Post Note  Patient: Paul Garner  Procedure(s) Performed: Procedure(s) (LRB): IRRIGATION AND DEBRIDEMENT PERIRECTAL ABSCESS (N/A)  Patient location during evaluation: PACU Anesthesia Type: General Level of consciousness: awake and alert Pain management: pain level controlled Vital Signs Assessment: post-procedure vital signs reviewed and stable Respiratory status: spontaneous breathing, nonlabored ventilation, respiratory function stable and patient connected to nasal cannula oxygen Cardiovascular status: blood pressure returned to baseline and stable Postop Assessment: no signs of nausea or vomiting Anesthetic complications: no    Last Vitals:  Filed Vitals:   04/01/16 1604 04/01/16 1612  BP: 131/77 131/79  Pulse: 76 72  Temp: 36.8 C 36.8 C  Resp: 14 14    Last Pain:  Filed Vitals:   04/01/16 1614  PainSc: [place]

## 2016-04-01 NOTE — Discharge Instructions (Signed)
AMBULATORY SURGERY  °DISCHARGE INSTRUCTIONS ° ° °1) The drugs that you were given will stay in your system until tomorrow so for the next 24 hours you should not: ° °A) Drive an automobile °B) Make any legal decisions °C) Drink any alcoholic beverage ° ° °2) You may resume regular meals tomorrow.  Today it is better to start with liquids and gradually work up to solid foods. ° °You may eat anything you prefer, but it is better to start with liquids, then soup and crackers, and gradually work up to solid foods. ° ° °3) Please notify your doctor immediately if you have any unusual bleeding, trouble breathing, redness and pain at the surgery site, drainage, fever, or pain not relieved by medication. ° ° ° °4) Additional Instructions: ° ° ° ° ° ° ° °Please contact your physician with any problems or Same Day Surgery at [phone], Monday through Friday 6 am to 4 pm, or [place] at [place] Main number at [phone]. °

## 2016-04-01 NOTE — ED Notes (Signed)
Pt. States rectal pain and swelling, denies bleeding or changes in BMs. Reported last BM this am, loose stool. Pt. States took stool softener last pm. Pt. Reports decreased appetite.

## 2016-04-01 NOTE — ED Notes (Signed)
Jewelry removed and pt ready for transportation to OR.

## 2016-04-01 NOTE — ED Notes (Signed)
Dr Jamal Collin at bedside .Marland Kitchen

## 2016-04-01 NOTE — ED Provider Notes (Signed)
[facility] Emergency Department Provider Note  ____________________________________________  Time seen: Approximately 12:26 PM  I have reviewed the triage vital signs and the nursing notes.   HISTORY  Chief Complaint Rectal Pain    HPI Paul Garner is a 60 y.o. male presents for evaluation of perirectal abscess and pain x 4 days. Patient was seen yesterday and referred to surgery. Instructed to come to the ER today by the Nurse Manager of [facility] for evaluation . Patient states that he started noting a bulging in the rectal area no relief with Preparation H or suppositories.   Past Medical History  Diagnosis Date  . PVC's (premature ventricular contractions)   . Coronary artery disease 06/22/2013    Stent placement to the Distal RCA & the mid RCA  . Hyperlipidemia     Patient Active Problem List   Diagnosis Date Noted  . Hyperlipidemia   . PVC's (premature ventricular contractions)   . Coronary artery disease 06/22/2013    Past Surgical History  Procedure Laterality Date  . Cardiac catheterization  06/22/2013  . Coronary angioplasty with stent placement  06/22/2013    stent placement in the distal RCA & mid RCA    Current Outpatient Rx  Name  Route  Sig  Dispense  Refill  . aspirin EC 81 MG tablet   Oral   Take 1 tablet (81 mg total) by mouth daily.         . diclofenac (VOLTAREN) 75 MG EC tablet   Oral   Take 75 mg by mouth 2 (two) times daily.          Marland Kitchen lidocaine-hydrocortisone (ANAMANTLE) 3-1 % KIT   Rectal   Place 1 application rectally 2 (two) times daily.   20 each   0   . rosuvastatin (CRESTOR) 20 MG tablet   Oral   Take 1 tablet (20 mg total) by mouth daily.   30 tablet   6   . ULORIC 80 MG TABS      80 mg daily.            Dispense as written.     Allergies Review of patient's allergies indicates no known allergies.  Family History  Problem Relation Age of Onset  . Heart disease Father      CABG x 2   . Heart attack Father 63  . Hyperlipidemia Father     Social History Social History  Substance Use Topics  . Smoking status: Never Smoker   . Smokeless tobacco: None  . Alcohol Use: 1.2 oz/week    1 Glasses of wine, 1 Cans of beer per week    Review of Systems Constitutional: No fever/chills Eyes: No visual changes. ENT: No sore throat. Cardiovascular: Denies chest pain. Respiratory: Denies shortness of breath. Gastrointestinal: No abdominal pain.  No nausea, no vomiting.  No diarrhea.  No constipation. Genitourinary: Negative for dysuria.Positive for rectal pain. Negative for blood or bleeding. Musculoskeletal: Negative for back pain. Skin: Negative for rash. Neurological: Negative for headaches, focal weakness or numbness.  10-point ROS otherwise negative.  ____________________________________________   PHYSICAL EXAM:  VITAL SIGNS: ED Triage Vitals  Enc Vitals Group     BP 04/01/16 1052 127/78 mmHg     Pulse Rate 04/01/16 1052 73     [email or web address] 04/01/16 1052 18     Temp 04/01/16 1052 98.4 F (36.9 C)     Temp Source 04/01/16 1052 Oral     SpO2 04/01/16 1052  100 %     Weight 04/01/16 1052 195 lb (88.451 kg)     Height 04/01/16 1052 6' (1.829 m)     Head Cir --      Peak Flow --      Pain Score 04/01/16 1053 6     Pain Loc --      Pain Edu? --      Excl. in [place]? --     Constitutional: Alert and oriented. Well appearing and in no acute distress. Eyes: Conjunctivae are normal. PERRL. EOMI. Head: Atraumatic. Nose: No congestion/rhinnorhea. Mouth/Throat: Mucous membranes are moist.  Oropharynx non-erythematous. Neck: No stridor.   Cardiovascular: Normal rate, regular rhythm. Grossly normal heart sounds.  Good peripheral circulation. Respiratory: Normal respiratory effort.  No  retractions. Lungs CTAB. Gastrointestinal: Soft and nontender. No distention. No abdominal bruits. No CVA tenderness. Rectal: 2 cm fluctuant lesion noted to peri-anal region. Minimal erythema, tender. Internal digital exam refused by the patient at this time. Musculoskeletal: No lower extremity tenderness nor edema.  No joint effusions. Neurologic:  Normal speech and language. No gross focal neurologic deficits are appreciated. No gait instability. Skin:  Skin is warm, dry and intact. No rash noted. Psychiatric: Mood and affect are normal. Speech and behavior are normal.  ____________________________________________   LABS (all labs ordered are listed, but only abnormal results are displayed)  Labs Reviewed - No data to display    PROCEDURES  Procedure(s) performed: None  Critical Care performed: No  ____________________________________________   INITIAL IMPRESSION / ASSESSMENT AND PLAN / ED COURSE  Pertinent labs & imaging results that were available during my care of the patient were reviewed by me and considered in my medical decision making (see chart for details).  Perirectal abscess. Referral to surgery on-call provided. Patient will be taken to the operating room for I&D and discharged home. ____________________________________________   FINAL CLINICAL IMPRESSION(S) / ED DIAGNOSES  Final diagnoses:  Perirectal abscess     This chart was dictated using voice recognition software/Dragon. Despite best efforts to proofread, errors can occur which can change the meaning. Any change was purely unintentional.   Arlyss Repress, PA-C 04/01/16 Pearson, PA-C 04/01/16 1324  Lavonia Drafts, MD 04/01/16 [phone]

## 2016-04-02 ENCOUNTER — Encounter: Payer: Self-pay | Admitting: General Surgery

## 2016-04-04 LAB — WOUND CULTURE: Culture: NO GROWTH

## 2016-04-11 ENCOUNTER — Encounter: Payer: Self-pay | Admitting: General Surgery

## 2016-04-11 ENCOUNTER — Ambulatory Visit (INDEPENDENT_AMBULATORY_CARE_PROVIDER_SITE_OTHER): Payer: 59 | Admitting: General Surgery

## 2016-04-11 VITALS — BP 121/74 | HR 71 | Temp 97.5°F | Ht 73.0 in | Wt 193.0 lb

## 2016-04-11 DIAGNOSIS — Z4889 Encounter for other specified surgical aftercare: Secondary | ICD-10-CM

## 2016-04-11 NOTE — Patient Instructions (Signed)
Please call our office if you have questions or concerns.   

## 2016-04-11 NOTE — Progress Notes (Signed)
Outpatient Surgical Follow Up  [id number]  Paul Garner is an 60 y.o. male.   Chief Complaint  Patient presents with  . Routine Post Op    I&D Perirectal Adscess    HPI: 60 year old male returns to clinic for follow-up of perirectal abscess drainage 10 days ago. Patient reports that he finish his antibiotics this morning. He states the drainage stopped completely. He denies fevers, chills, nausea, vomiting, diarrhea, constant. He is not having any pain in his been very happy with his surgical experience.  Past Medical History  Diagnosis Date  . PVC's (premature ventricular contractions)   . Coronary artery disease 06/22/2013    Stent placement to the Distal RCA & the mid RCA  . Hyperlipidemia     Past Surgical History  Procedure Laterality Date  . Cardiac catheterization  06/22/2013  . Coronary angioplasty with stent placement  06/22/2013    stent placement in the distal RCA & mid RCA  . Incision and drainage perirectal abscess N/A 04/01/2016    Procedure: IRRIGATION AND DEBRIDEMENT PERIRECTAL ABSCESS;  Surgeon: Christene Lye, MD;  Location: [facility] ORS;  Service: General;  Laterality: N/A;    Family History  Problem Relation Age of Onset  . Heart disease Father     CABG x 2   . Heart attack Father 40  . Hyperlipidemia Father     Social History:  reports that he has never smoked. He does not have any smokeless tobacco history on file. He reports that he drinks about 1.2 oz of alcohol per week. He reports that he does not use illicit drugs.  Allergies: No Known Allergies  Medications reviewed.    ROS  A multipoint review of systems was completed. All pertinent positives and negatives are documented within the history of present illness and remainder are negative.  BP 121/74 mmHg  Pulse 71  Temp(Src) 97.5 F (36.4 C) (Oral)  Ht 6\' 1"  ([id number] m)  Wt 87.544 kg (193 lb)  BMI 25.47 kg/m2  Physical Exam  Gen.: No acute distress Chest: Clear to  auscultation Heart: Regular rhythm Abdomen: Soft and nontender Rectum: Site of previous incision and drainage examined without any evidence of erythema, drainage, fluctuance, tenderness.   No results found for this or any previous visit (from the past 48 hour(s)). No results found.  Assessment/Plan:  1. Aftercare following surgery 60 year old male status post incision and drainage perirectal abscess. Discussed with the patient that it is possible for these to recur and that he is to report to clinic immediately should he notice any of the symptoms returning. Otherwise discussed that it is okay to return to his normal activities barring any signs of infection. All questions answered to the patient's satisfaction and he'll follow up with clinic on an as-needed basis.     Clayburn Pert, MD FACS General Surgeon  04/11/2016,1:52 PM

## 2016-05-07 ENCOUNTER — Encounter: Payer: Self-pay | Admitting: Surgery

## 2016-05-07 ENCOUNTER — Ambulatory Visit (INDEPENDENT_AMBULATORY_CARE_PROVIDER_SITE_OTHER): Payer: 59 | Admitting: Surgery

## 2016-05-07 VITALS — BP 134/74 | HR 68 | Temp 97.9°F | Ht 73.0 in | Wt 193.0 lb

## 2016-05-07 DIAGNOSIS — K611 Rectal abscess: Secondary | ICD-10-CM

## 2016-05-07 NOTE — Progress Notes (Signed)
60 year old male who had a perirectal abscess in June that was lanced by Dr. Jamal Collin. Patient was then seen in follow-up on June 16 with my partner Dr. Adonis Huguenin. Patient states she's been doing well since then but still has one area of some tenderness that occasionally has a little bit of bleeding whenever he wipes. Patient states that his stools is been nice and soft since then he has been making sure to maintain 1-2 soft bowel movements a day. The fever chills  Filed Vitals:   05/07/16 1415  BP: 134/74  Pulse: 68  Temp: 97.9 F (36.6 C)   PE:  Gen: NAD Rectum: healing area from perirectal abscess drainage, minimal tenderness, no induration or erythema, no hemorrhoids  A/P:  Patient is doing well after perirectal abscess drainage however he still has some tenderness in the area but is doing well for stools. Informed him that this area is healing over nicely and appears to be healthy. It does not appear as if this area is returning. He is to call us if the pain continues to worsen does not improve or if he can feels as if the abscess is returning.

## 2016-05-07 NOTE — Patient Instructions (Signed)
Please call with any questions or concerns.

## 2016-07-24 ENCOUNTER — Ambulatory Visit (INDEPENDENT_AMBULATORY_CARE_PROVIDER_SITE_OTHER): Payer: 59 | Admitting: Internal Medicine

## 2016-07-24 ENCOUNTER — Encounter: Payer: Self-pay | Admitting: Internal Medicine

## 2016-07-24 VITALS — BP 110/78 | HR 74 | Resp 16 | Ht 73.0 in

## 2016-07-24 DIAGNOSIS — M1990 Unspecified osteoarthritis, unspecified site: Secondary | ICD-10-CM

## 2016-07-24 DIAGNOSIS — F329 Major depressive disorder, single episode, unspecified: Secondary | ICD-10-CM | POA: Diagnosis not present

## 2016-07-24 DIAGNOSIS — F32A Depression, unspecified: Secondary | ICD-10-CM

## 2016-07-24 DIAGNOSIS — E785 Hyperlipidemia, unspecified: Secondary | ICD-10-CM | POA: Diagnosis not present

## 2016-07-24 DIAGNOSIS — I2510 Atherosclerotic heart disease of native coronary artery without angina pectoris: Secondary | ICD-10-CM

## 2016-07-24 DIAGNOSIS — C44611 Basal cell carcinoma of skin of unspecified upper limb, including shoulder: Secondary | ICD-10-CM | POA: Insufficient documentation

## 2016-07-24 DIAGNOSIS — M109 Gout, unspecified: Secondary | ICD-10-CM | POA: Insufficient documentation

## 2016-07-24 DIAGNOSIS — M19042 Primary osteoarthritis, left hand: Secondary | ICD-10-CM

## 2016-07-24 DIAGNOSIS — M19041 Primary osteoarthritis, right hand: Secondary | ICD-10-CM | POA: Insufficient documentation

## 2016-07-24 MED ORDER — ESCITALOPRAM OXALATE 10 MG PO TABS: 10.0000 mg | ORAL_TABLET | Freq: Every day | ORAL | 1 refills | Status: DC

## 2016-07-24 NOTE — Progress Notes (Signed)
Date:  07/24/2016   Name:  Paul Garner   DOB:  [id number]   MRN:  [id number]   Chief Complaint: Establish Care and Depression (Mornings are bad. ) Depression         This is a recurrent problem.  The current episode started more than 1 year ago.   The problem occurs intermittently.  Associated symptoms include insomnia (early wakening).  Associated symptoms include no appetite change, no headaches and no suicidal ideas.  Past treatments include SNRIs - Serotonin and norepinephrine reuptake inhibitors (stopped Effexor about a year ago).  Previous treatment provided moderate (but too sedated) relief. He also previously took mirtazapine to help with sleep but he taper that off as well. The only other medication that he has taken in the past his Paxil which he took a number of years ago for a brief period of time. Is very concerned about medication that may cause sedation.    Review of Systems  Constitutional: Negative for appetite change and unexpected weight change.  Respiratory: Negative for cough, chest tightness and shortness of breath.   Cardiovascular: Negative for chest pain and palpitations.  Neurological: Negative for dizziness and headaches.  Psychiatric/Behavioral: Positive for depression, dysphoric mood and sleep disturbance. Negative for agitation, confusion and suicidal ideas. The patient is nervous/anxious (occasionally) and has insomnia (early wakening).     Patient Active Problem List   Diagnosis Date Noted  . Basal cell carcinoma, arm 07/24/2016  . Hyperlipidemia   . PVC's (premature ventricular contractions)   . Coronary artery disease 06/22/2013    Prior to Admission medications   Medication Sig Start Date End Date Taking? Authorizing Provider  aspirin EC 81 MG tablet Take 1 tablet (81 mg total) by mouth daily. 12/07/15  Yes Deboraha Sprang, MD  diclofenac (VOLTAREN) 75 MG EC tablet Take 75 mg by mouth 2 (two) times daily.    Yes Historical Provider, MD    rosuvastatin (CRESTOR) 20 MG tablet Take 1 tablet (20 mg total) by mouth daily. 03/07/16  Yes Wellington Hampshire, MD    No Known Allergies  Past Surgical History:  Procedure Laterality Date  . CARDIAC CATHETERIZATION  06/22/2013  . CORONARY ANGIOPLASTY WITH STENT PLACEMENT  06/22/2013   stent placement in the distal RCA & mid RCA  . INCISION AND DRAINAGE PERIRECTAL ABSCESS N/A 04/01/2016   Procedure: IRRIGATION AND DEBRIDEMENT PERIRECTAL ABSCESS;  Surgeon: Christene Lye, MD;  Location: [facility] ORS;  Service: General;  Laterality: N/A;    Social History  Substance Use Topics  . Smoking status: Never Smoker  . Smokeless tobacco: Former Systems developer    Quit date: 05/07/1989  . Alcohol use Yes     Comment: 5-10 Beers or Glasses of Wine Weekly     Medication list has been reviewed and updated.   Physical Exam  Constitutional: He is oriented to person, place, and time. He appears well-developed. No distress.  HENT:  Head: Normocephalic and atraumatic.  Neck: Carotid bruit is not present.  Cardiovascular: Normal rate, regular rhythm and normal heart sounds.   Pulmonary/Chest: Effort normal and breath sounds normal. No respiratory distress.  Musculoskeletal: Normal range of motion.  Neurological: He is alert and oriented to person, place, and time.  Skin: Skin is warm and dry. No rash noted.  Psychiatric: He has a normal mood and affect. His speech is normal and behavior is normal. Thought content normal.  Nursing note and vitals reviewed.   BP 110/78   Pulse  74   Resp 16   Ht 6\' 1"  (1.854 m)   Wt 182 lb (82.6 kg)   SpO2 98%   BMI 24.01 kg/m   Assessment and Plan: 1. Depression Follow up in 6 weeks No sleep medication for now - should improve with treatment of depression - escitalopram (LEXAPRO) 10 MG tablet; Take 1 tablet (10 mg total) by mouth daily.  Dispense: 30 tablet; Refill: 1  2. Coronary artery disease involving native coronary artery of native heart without angina  pectoris Stable; continue ASA  3. Hyperlipidemia On statin therapy Lab Results  Component Value Date   CHOL 137 12/12/2015   HDL 41 12/12/2015   LDLCALC 67 12/12/2015   TRIG 144 12/12/2015   CHOLHDL 3.3 12/12/2015     4. Gout, unspecified cause, unspecified chronicity, unspecified site On no medication currently Follow up as needed   Halina Maidens, MD [facility]  07/24/2016

## 2016-08-25 ENCOUNTER — Encounter: Payer: Self-pay | Admitting: Internal Medicine

## 2016-08-25 DIAGNOSIS — F339 Major depressive disorder, recurrent, unspecified: Secondary | ICD-10-CM | POA: Insufficient documentation

## 2016-09-04 ENCOUNTER — Encounter: Payer: Self-pay | Admitting: Internal Medicine

## 2016-09-04 ENCOUNTER — Ambulatory Visit (INDEPENDENT_AMBULATORY_CARE_PROVIDER_SITE_OTHER): Payer: 59 | Admitting: Internal Medicine

## 2016-09-04 VITALS — BP 120/82 | HR 67 | Resp 16 | Ht 73.0 in | Wt 192.0 lb

## 2016-09-04 DIAGNOSIS — Z23 Encounter for immunization: Secondary | ICD-10-CM

## 2016-09-04 DIAGNOSIS — F3341 Major depressive disorder, recurrent, in partial remission: Secondary | ICD-10-CM | POA: Insufficient documentation

## 2016-09-04 DIAGNOSIS — F324 Major depressive disorder, single episode, in partial remission: Secondary | ICD-10-CM | POA: Diagnosis not present

## 2016-09-04 MED ORDER — ESCITALOPRAM OXALATE 10 MG PO TABS: 10.0000 mg | ORAL_TABLET | Freq: Every day | ORAL | 0 refills | Status: DC

## 2016-09-04 NOTE — Progress Notes (Signed)
Date:  09/04/2016   Name:  Paul Garner   DOB:  [id number]   MRN:  [id number]   Chief Complaint: Depression (Meds working well) Depression         This is a new problem.  The current episode started 1 to 4 weeks ago.   The onset quality is gradual.   The problem has been gradually improving since onset.  Associated symptoms include no fatigue, no appetite change, no headaches and no suicidal ideas.  Past treatments include SSRIs - Selective serotonin reuptake inhibitors.  Compliance with treatment is good. Depressed mood and anxiety are much better.  He is happy with the current dose.    Review of Systems  Constitutional: Negative for appetite change, fatigue and unexpected weight change.  Eyes: Negative for visual disturbance.  Respiratory: Negative for cough, shortness of breath and wheezing.   Cardiovascular: Negative for chest pain, palpitations and leg swelling.  Gastrointestinal: Negative for abdominal pain.  Endocrine: Negative for polydipsia and polyuria.  Genitourinary: Negative for dysuria and hematuria.  Skin: Negative for color change and rash.  Neurological: Negative for headaches.  Psychiatric/Behavioral: Positive for depression. Negative for dysphoric mood and suicidal ideas. The patient is not nervous/anxious.     Patient Active Problem List   Diagnosis Date Noted  . 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 Basal cell carcinoma, arm 07/24/2016  . Gout 07/24/2016  . Arthritis of both hands 07/24/2016  . Hyperlipidemia   . PVC's (premature ventricular contractions)   . Coronary artery disease 06/22/2013    Prior to Admission medications   Medication Sig Start Date End Date Taking? Authorizing Provider  aspirin EC 81 MG tablet Take 1 tablet (81 mg total) by mouth daily. 12/07/15  Yes Deboraha Sprang, MD  diclofenac (VOLTAREN) 75 MG EC tablet Take 75 mg by mouth 2 (two) times daily.    Yes Historical Provider, MD  escitalopram (LEXAPRO) 10 MG tablet Take  1 tablet (10 mg total) by mouth daily. 07/24/16  Yes Glean Hess, MD  rosuvastatin (CRESTOR) 20 MG tablet Take 1 tablet (20 mg total) by mouth daily. 03/07/16  Yes Wellington Hampshire, MD    No Known Allergies  Past Surgical History:  Procedure Laterality Date  . CARDIAC CATHETERIZATION  06/22/2013  . CORONARY ANGIOPLASTY WITH STENT PLACEMENT  06/22/2013   stent placement in the distal RCA & mid RCA  . INCISION AND DRAINAGE PERIRECTAL ABSCESS N/A 04/01/2016   Procedure: IRRIGATION AND DEBRIDEMENT PERIRECTAL ABSCESS;  Surgeon: Christene Lye, MD;  Location: [facility] ORS;  Service: General;  Laterality: N/A;    Social History  Substance Use Topics  . Smoking status: Never Smoker  . Smokeless tobacco: Former Systems developer    Quit date: 05/07/1989  . Alcohol use Yes     Comment: 5-10 Beers or Glasses of Wine Weekly     Medication list has been reviewed and updated.   Physical Exam  Constitutional: He is oriented to person, place, and time. He appears well-developed. No distress.  HENT:  Head: Normocephalic and atraumatic.  Cardiovascular: Normal rate, regular rhythm and normal heart sounds.   Pulmonary/Chest: Effort normal and breath sounds normal. No respiratory distress.  Musculoskeletal: Normal range of motion.  Neurological: He is alert and oriented to person, place, and time.  Skin: Skin is warm and dry. No rash noted.  Psychiatric: He has a normal mood and affect. His behavior is normal. Thought content normal.  Nursing note and vitals reviewed.  BP 120/82   Pulse 67   Resp 16   Ht 6\' 1"  (1.854 m)   Wt 192 lb (87.1 kg)   SpO2 99%   BMI 25.33 kg/m   Assessment and Plan: 1. Major depressive disorder with single episode, in partial remission ([place]) Doing well - advised continue therapy for 6-12 months before considering taper/discontinuation - escitalopram (LEXAPRO) 10 MG tablet; Take 1 tablet (10 mg total) by mouth daily.  Dispense: 90 tablet; Refill: 0  2. Need for  influenza vaccination - Flu Vaccine QUAD 36+ mos IM   Halina Maidens, MD [facility]  09/04/2016

## 2016-10-03 ENCOUNTER — Telehealth: Payer: Self-pay | Admitting: Cardiovascular Disease

## 2016-10-03 NOTE — Telephone Encounter (Signed)
Patient called wanting to know if he needs labs for cholesterol? Please call patient.

## 2016-10-03 NOTE — Telephone Encounter (Signed)
Pt was advised at last ov 03/07/16 to have labs at or before 6 mo f/u.  Can you set him up an appt for labs before his next visit on 11/11/16? Thank you!

## 2016-10-04 NOTE — Telephone Encounter (Signed)
Spoke to patient and he is coming on 10/23/16

## 2016-10-23 ENCOUNTER — Other Ambulatory Visit (INDEPENDENT_AMBULATORY_CARE_PROVIDER_SITE_OTHER): Payer: 59

## 2016-10-23 DIAGNOSIS — E785 Hyperlipidemia, unspecified: Secondary | ICD-10-CM

## 2016-10-23 DIAGNOSIS — E784 Other hyperlipidemia: Secondary | ICD-10-CM | POA: Diagnosis not present

## 2016-10-23 DIAGNOSIS — E7849 Other hyperlipidemia: Secondary | ICD-10-CM

## 2016-10-23 DIAGNOSIS — I2510 Atherosclerotic heart disease of native coronary artery without angina pectoris: Secondary | ICD-10-CM

## 2016-10-24 LAB — LIPID PANEL
Chol/HDL Ratio: 3.1 ratio units (ref 0.0–5.0)
Cholesterol, Total: 169 mg/dL (ref 100–199)
HDL: 54 mg/dL (ref >39)
LDL CALC: 69 mg/dL (ref 0–99)
Triglycerides: 231 mg/dL — ABNORMAL HIGH (ref 0–149)
VLDL CHOLESTEROL CAL: 46 mg/dL — AB (ref 5–40)

## 2016-10-28 ENCOUNTER — Other Ambulatory Visit: Payer: Self-pay | Admitting: Internal Medicine

## 2016-10-28 DIAGNOSIS — F324 Major depressive disorder, single episode, in partial remission: Secondary | ICD-10-CM

## 2016-11-11 ENCOUNTER — Ambulatory Visit (INDEPENDENT_AMBULATORY_CARE_PROVIDER_SITE_OTHER): Payer: Managed Care, Other (non HMO) | Admitting: Cardiovascular Disease

## 2016-11-11 ENCOUNTER — Encounter: Payer: Self-pay | Admitting: Cardiovascular Disease

## 2016-11-11 VITALS — BP 118/70 | HR 59 | Ht 72.0 in | Wt 193.2 lb

## 2016-11-11 DIAGNOSIS — I2510 Atherosclerotic heart disease of native coronary artery without angina pectoris: Secondary | ICD-10-CM

## 2016-11-11 DIAGNOSIS — E782 Mixed hyperlipidemia: Secondary | ICD-10-CM | POA: Diagnosis not present

## 2016-11-11 DIAGNOSIS — I493 Ventricular premature depolarization: Secondary | ICD-10-CM | POA: Diagnosis not present

## 2016-11-11 MED ORDER — ROSUVASTATIN CALCIUM 20 MG PO TABS: 20.0000 mg | ORAL_TABLET | Freq: Every day | ORAL | 3 refills | Status: DC

## 2016-11-11 NOTE — Progress Notes (Signed)
Cardiology Office Note   Date:  11/11/2016   ID:  Paul Garner, DOB [id number], MRN [id number]  PCP:  Halina Maidens, MD  Cardiologist:   Kathlyn Sacramento, MD   Chief Complaint  Patient presents with  . other    6 month f/u. Meds reviewed verbally with pt.      History of Present Illness: Paul Garner is a 61 y.o. male who is here today for a follow-up visit regarding coronary artery disease and PVCs. He has known history of coronary artery disease with previous stenting of the right coronary artery in 2014 for stable angina. No previous myocardial infarction. Other medical problems include PVCs, hyperlipidemia and gout. He is a lifelong nonsmoker. He has extensive family history of coronary artery disease. He was seen by Dr. Caryl Comes for PVCs. He had a nuclear stress test which showed no evidence of ischemia with normal ejection fraction. Holter monitor showed 1700 PVCs representing 1%. He was hospitalized in June 2017 with a small perirectal abscess which required drainage. He has been doing well from a cardiac standpoint and denies any chest pain or shortness of breath. He has mild palpitations overall.    Past Medical History:  Diagnosis Date  . Coronary artery disease 06/22/2013   Stent placement to the Distal RCA & the mid RCA  . Hyperlipidemia   . PVC's (premature ventricular contractions)     Past Surgical History:  Procedure Laterality Date  . CARDIAC CATHETERIZATION  06/22/2013  . CORONARY ANGIOPLASTY WITH STENT PLACEMENT  06/22/2013   stent placement in the distal RCA & mid RCA  . INCISION AND DRAINAGE PERIRECTAL ABSCESS N/A 04/01/2016   Procedure: IRRIGATION AND DEBRIDEMENT PERIRECTAL ABSCESS;  Surgeon: Christene Lye, MD;  Location: [facility] ORS;  Service: General;  Laterality: N/A;     Current Outpatient Prescriptions  Medication Sig Dispense Refill  . aspirin EC 81 MG tablet Take 1 tablet (81 mg total) by mouth daily.    . Cephalexin (KEFLEX PO) Take  by mouth 2 (two) times daily.    . diclofenac (VOLTAREN) 75 MG EC tablet Take 75 mg by mouth 2 (two) times daily.     Marland Kitchen escitalopram (LEXAPRO) 10 MG tablet TAKE 1 TABLET BY MOUTH  DAILY 90 tablet 1  . rosuvastatin (CRESTOR) 20 MG tablet Take 1 tablet (20 mg total) by mouth daily. 30 tablet 6   No current facility-administered medications for this visit.     Allergies:   Patient has no known allergies.    Social History:  The patient  reports that he has never smoked. He quit smokeless tobacco use about 27 years ago. He reports that he drinks alcohol. He reports that he does not use drugs.   Family History:  The patient's family history includes Cancer (age of onset: 62) in his mother; Heart attack (age of onset: 92) in his father; Heart disease in his father; Hyperlipidemia in his father.    ROS:  Please see the history of present illness.   Otherwise, review of systems are positive for none.   All other systems are reviewed and negative.    PHYSICAL EXAM: VS:  BP 118/70 (BP Location: Left Arm, Patient Position: Sitting, Cuff Size: Normal)   Pulse (!) 59   Ht 6' (1.829 m)   Wt 193 lb 4 oz (87.7 kg)   BMI 26.21 kg/m  , BMI Body mass index is 26.21 kg/m. GEN: Well nourished, well developed, in no acute distress  HEENT:  normal  Neck: no JVD, carotid bruits, or masses Cardiac: RRR; no murmurs, rubs, or gallops,no edema  Respiratory:  clear to auscultation bilaterally, normal work of breathing GI: soft, nontender, nondistended, + BS MS: no deformity or atrophy  Skin: warm and dry, no rash Neuro:  Strength and sensation are intact Psych: euthymic mood, full affect   EKG:  EKG is ordered today. EKG shows sinus bradycardia with no significant ST or T wave changes.   Recent Labs: 04/01/2016: Hemoglobin 15.1; Platelets 195    Lipid Panel    Component Value Date/Time   CHOL 169 10/23/2016 0835   TRIG 231 (H) 10/23/2016 0835   HDL 54 10/23/2016 0835   CHOLHDL 3.1 10/23/2016  0835   CHOLHDL 3.3 12/12/2015 1007   VLDL 29 12/12/2015 1007   LDLCALC 69 10/23/2016 0835      Wt Readings from Last 3 Encounters:  11/11/16 193 lb 4 oz (87.7 kg)  09/04/16 192 lb (87.1 kg)  05/07/16 193 lb (87.5 kg)        ASSESSMENT AND PLAN:  1.  Coronary artery disease involving native coronary arteries without angina: He is overall doing well with no anginal symptoms. Continue low-dose aspirin.   2. Hyperlipidemia: I reviewed most recent lipid profile with him today. LDL was at target at 69. Triglyceride was mildly elevated at 231 but he reports dietary indiscretion around the holiday season. I discussed with him the importance of low carb low fat diet. He reports that he took fish oil in the past and was not effective. Continue same management for now.  3. PVCs: The burden was not significant enough to require treatment. Continue to monitor symptoms.   Disposition:   FU with me in 12 months  Signed,  Kathlyn Sacramento, MD  11/11/2016 2:40 PM    [facility]

## 2016-11-11 NOTE — Patient Instructions (Signed)
Medication Instructions:  Your physician recommends that you continue on your current medications as directed. Please refer to the Current Medication list given to you today.   Labwork: none  Testing/Procedures: none  Follow-Up: Your physician wants you to follow-up in: one year with Dr. Arida.  You will receive a reminder letter in the mail two months in advance. If you don't receive a letter, please call our office to schedule the follow-up appointment.   Any Other Special Instructions Will Be Listed Below (If Applicable).     If you need a refill on your cardiac medications before your next appointment, please call your pharmacy.   

## 2016-11-15 NOTE — Telephone Encounter (Signed)
I said that he needed to schedule CPX and labs this summer, not February.

## 2016-11-15 NOTE — Telephone Encounter (Signed)
pts coming in on 11/29/16 for his cpe pt stated on the call he just had some blood work done recently cholesterol being one of them.

## 2016-11-15 NOTE — Telephone Encounter (Signed)
pts appt was made back on 07/24/16, would you like me to call patient and r/s this appt to summer?

## 2016-11-29 ENCOUNTER — Ambulatory Visit (INDEPENDENT_AMBULATORY_CARE_PROVIDER_SITE_OTHER): Payer: Managed Care, Other (non HMO) | Admitting: Internal Medicine

## 2016-11-29 ENCOUNTER — Encounter: Payer: Self-pay | Admitting: Internal Medicine

## 2016-11-29 VITALS — BP 108/80 | HR 60 | Temp 97.8°F | Ht 72.0 in | Wt 192.0 lb

## 2016-11-29 DIAGNOSIS — M109 Gout, unspecified: Secondary | ICD-10-CM | POA: Diagnosis not present

## 2016-11-29 DIAGNOSIS — Z1211 Encounter for screening for malignant neoplasm of colon: Secondary | ICD-10-CM | POA: Diagnosis not present

## 2016-11-29 DIAGNOSIS — Z125 Encounter for screening for malignant neoplasm of prostate: Secondary | ICD-10-CM

## 2016-11-29 DIAGNOSIS — M19042 Primary osteoarthritis, left hand: Secondary | ICD-10-CM | POA: Diagnosis not present

## 2016-11-29 DIAGNOSIS — Z0000 Encounter for general adult medical examination without abnormal findings: Secondary | ICD-10-CM

## 2016-11-29 DIAGNOSIS — F324 Major depressive disorder, single episode, in partial remission: Secondary | ICD-10-CM | POA: Diagnosis not present

## 2016-11-29 DIAGNOSIS — M19041 Primary osteoarthritis, right hand: Secondary | ICD-10-CM

## 2016-11-29 DIAGNOSIS — I2510 Atherosclerotic heart disease of native coronary artery without angina pectoris: Secondary | ICD-10-CM

## 2016-11-29 DIAGNOSIS — Z1159 Encounter for screening for other viral diseases: Secondary | ICD-10-CM | POA: Diagnosis not present

## 2016-11-29 DIAGNOSIS — S46912A Strain of unspecified muscle, fascia and tendon at shoulder and upper arm level, left arm, initial encounter: Secondary | ICD-10-CM

## 2016-11-29 LAB — POCT URINALYSIS DIPSTICK
Bilirubin, UA: NEGATIVE
Glucose, UA: NEGATIVE
Leukocytes, UA: NEGATIVE
Nitrite, UA: NEGATIVE
PH UA: 5
PROTEIN UA: NEGATIVE
UROBILINOGEN UA: 0.2

## 2016-11-29 MED ORDER — ESCITALOPRAM OXALATE 10 MG PO TABS: 10.0000 mg | ORAL_TABLET | Freq: Every day | ORAL | 1 refills | Status: DC

## 2016-11-29 NOTE — Progress Notes (Signed)
Date:  11/29/2016   Name:  Paul Garner   DOB:  [id number]   MRN:  [id number]   Chief Complaint: Annual Exam Paul Garner is a 61 y.o. male who presents today for his Complete Annual Exam. He feels fairly well. He reports exercising intermittently. He reports he is sleeping fairly well.  He is probably due for colonoscopy - last done out of town around age 55.  He has not had any gout issues off of medication but would like UA checked.  Hyperlipidemia  This is a chronic problem. The problem is controlled. Pertinent negatives include no chest pain, myalgias or shortness of breath. Current antihyperlipidemic treatment includes statins. The current treatment provides significant improvement of lipids. There are no compliance problems.   Shoulder Pain   The pain is present in the left shoulder. This is a new problem. The current episode started 1 to 4 weeks ago. The problem occurs intermittently. The problem has been unchanged. The quality of the pain is described as aching. The pain is mild. The symptoms are aggravated by activity. He has tried NSAIDS for the symptoms.   CAD - 2 stents placed in 2014.  Followed by Dr. Fletcher Anon.  Had a check up 2 weeks ago - lipids and EKG.  No recommendations for stress testing.  Depression - on lexapro. No side effects or sleep issues. He is pleased with his response and treatment.  Review of Systems  Constitutional: Negative for appetite change, chills, diaphoresis, fatigue and unexpected weight change.  HENT: Negative for hearing loss, tinnitus, trouble swallowing and voice change.   Eyes: Negative for visual disturbance.  Respiratory: Negative for choking, shortness of breath and wheezing.   Cardiovascular: Negative for chest pain, palpitations and leg swelling.  Gastrointestinal: Negative for abdominal pain, blood in stool, constipation and diarrhea.  Genitourinary: Positive for difficulty urinating (occasional nocturia). Negative for dysuria,  frequency and hematuria.  Musculoskeletal: Positive for arthralgias (left shoulder). Negative for back pain and myalgias.  Skin: Negative for color change and rash.  Neurological: Negative for dizziness, syncope and headaches.  Hematological: Negative for adenopathy.  Psychiatric/Behavioral: Negative for dysphoric mood and sleep disturbance.    Patient Active Problem List   Diagnosis Date Noted  . Depression, major, recurrent, in partial remission ([place]) 09/04/2016  . Basal cell carcinoma, arm 07/24/2016  . Gout 07/24/2016  . Arthritis of both hands 07/24/2016  . Hyperlipidemia   . PVC's (premature ventricular contractions)   . Coronary artery disease 06/22/2013    Prior to Admission medications   Medication Sig Start Date End Date Taking? Authorizing Provider  aspirin EC 81 MG tablet Take 1 tablet (81 mg total) by mouth daily. 12/07/15   Deboraha Sprang, MD  Cephalexin (KEFLEX PO) Take by mouth 2 (two) times daily.    Historical Provider, MD  diclofenac (VOLTAREN) 75 MG EC tablet Take 75 mg by mouth 2 (two) times daily.     Historical Provider, MD  escitalopram (LEXAPRO) 10 MG tablet TAKE 1 TABLET BY MOUTH  DAILY 10/28/16   Glean Hess, MD  rosuvastatin (CRESTOR) 20 MG tablet Take 1 tablet (20 mg total) by mouth daily. 11/11/16   Wellington Hampshire, MD    No Known Allergies  Past Surgical History:  Procedure Laterality Date  . CARDIAC CATHETERIZATION  06/22/2013  . CORONARY ANGIOPLASTY WITH STENT PLACEMENT  06/22/2013   stent placement in the distal RCA & mid RCA  . INCISION AND DRAINAGE PERIRECTAL ABSCESS N/A  04/01/2016   Procedure: IRRIGATION AND DEBRIDEMENT PERIRECTAL ABSCESS;  Surgeon: Christene Lye, MD;  Location: [facility] ORS;  Service: General;  Laterality: N/A;    Social History  Substance Use Topics  . Smoking status: Never Smoker  . Smokeless tobacco: Never Used  . Alcohol use Yes     Comment: 5-10 Beers or Glasses of Wine Weekly     Medication list has been  reviewed and updated.   Physical Exam  Constitutional: He is oriented to person, place, and time. He appears well-developed and well-nourished.  HENT:  Head: Normocephalic.  Right Ear: Tympanic membrane, external ear and ear canal normal.  Left Ear: Tympanic membrane, external ear and ear canal normal.  Nose: Nose normal.  Mouth/Throat: Uvula is midline and oropharynx is clear and moist.  Eyes: Conjunctivae and EOM are normal. Pupils are equal, round, and reactive to light.  Neck: Normal range of motion. Neck supple. Carotid bruit is not present. No thyromegaly present.  Cardiovascular: Normal rate, regular rhythm, normal heart sounds and intact distal pulses.   Pulmonary/Chest: Effort normal and breath sounds normal. He has no wheezes. Right breast exhibits no mass. Left breast exhibits no mass.  Abdominal: Soft. Normal appearance and bowel sounds are normal. There is no hepatosplenomegaly. There is no tenderness.  Musculoskeletal: He exhibits no edema.       Left shoulder: He exhibits tenderness. He exhibits no effusion, no crepitus and normal strength.  Lymphadenopathy:    He has no cervical adenopathy.  Neurological: He is alert and oriented to person, place, and time. He has normal reflexes.  Skin: Skin is warm, dry and intact.  Psychiatric: He has a normal mood and affect. His speech is normal and behavior is normal. Judgment and thought content normal.  Nursing note and vitals reviewed.   BP 108/80   Pulse 60   Temp 97.8 F (36.6 C)   Ht 6' (1.829 m)   Wt 192 lb (87.1 kg)   SpO2 100%   BMI 26.04 kg/m   Assessment and Plan: 1. Annual physical exam - POCT urinalysis dipstick  2. Major depressive disorder with single episode, in partial remission ([place]) Doing well on current therapy - continue - escitalopram (LEXAPRO) 10 MG tablet; Take 1 tablet (10 mg total) by mouth daily.  Dispense: 90 tablet; Refill: 1  3. Need for hepatitis C screening test - Hepatitis C  antibody  4. Coronary artery disease involving native coronary artery of native heart without angina pectoris Followed by Cardiology Recent lipids Lab Results  Component Value Date   CHOL 169 10/23/2016   HDL 54 10/23/2016   LDLCALC 69 10/23/2016   TRIG 231 (H) 10/23/2016   CHOLHDL 3.1 10/23/2016    - Comprehensive metabolic panel - CBC with Differential/Platelet  5. Arthritis of both hands Continue nsaids   6. Gout, unspecified cause, unspecified chronicity, unspecified site No recent flares off of Uloric - Uric acid  7. Prostate cancer screening DRE deferred - PSA  8. Colon cancer screening - Ambulatory referral to Gastroenterology  9. Shoulder strain, left, initial encounter Continue nsaids Use heat, topicals Refer to Ortho if no improvement   Halina Maidens, MD [facility]  11/29/2016

## 2016-11-30 LAB — COMPREHENSIVE METABOLIC PANEL
ALK PHOS: 66 IU/L (ref 39–117)
ALT: 29 IU/L (ref 0–44)
AST: 25 IU/L (ref 0–40)
Albumin/Globulin Ratio: 1.9 (ref 1.2–2.2)
Albumin: 4.6 g/dL (ref 3.5–5.5)
BUN/Creatinine Ratio: 20 (ref 9–20)
BUN: 19 mg/dL (ref 6–24)
Bilirubin Total: 0.3 mg/dL (ref 0.0–1.2)
CO2: 23 mmol/L (ref 18–29)
CREATININE: 0.96 mg/dL (ref 0.76–1.27)
Calcium: 9.6 mg/dL (ref 8.7–10.2)
Chloride: 102 mmol/L (ref 96–106)
GFR calc Af Amer: 100 mL/min/{1.73_m2} (ref >59)
GFR calc non Af Amer: 87 mL/min/{1.73_m2} (ref >59)
GLOBULIN, TOTAL: 2.4 g/dL (ref 1.5–4.5)
GLUCOSE: 96 mg/dL (ref 65–99)
Potassium: 4.9 mmol/L (ref 3.5–5.2)
SODIUM: 141 mmol/L (ref 134–144)
Total Protein: 7 g/dL (ref 6.0–8.5)

## 2016-11-30 LAB — CBC WITH DIFFERENTIAL/PLATELET
Basophils Absolute: 0 10*3/uL (ref 0.0–0.2)
Basos: 1 %
EOS (ABSOLUTE): 0.2 10*3/uL (ref 0.0–0.4)
EOS: 2 %
HEMATOCRIT: 43.7 % (ref 37.5–51.0)
Hemoglobin: 14.8 g/dL (ref 13.0–17.7)
IMMATURE GRANULOCYTES: 2 %
Immature Grans (Abs): 0.1 10*3/uL (ref 0.0–0.1)
Lymphocytes Absolute: 2.2 10*3/uL (ref 0.7–3.1)
Lymphs: 33 %
MCH: 32.2 pg (ref 26.6–33.0)
MCHC: 33.9 g/dL (ref 31.5–35.7)
MCV: 95 fL (ref 79–97)
MONOS ABS: 0.3 10*3/uL (ref 0.1–0.9)
Monocytes: 5 %
NEUTROS PCT: 57 %
Neutrophils Absolute: 3.8 10*3/uL (ref 1.4–7.0)
PLATELETS: 207 10*3/uL (ref 150–379)
RBC: 4.6 x10E6/uL (ref 4.14–5.80)
RDW: 13.5 % (ref 12.3–15.4)
WBC: 6.7 10*3/uL (ref 3.4–10.8)

## 2016-11-30 LAB — URIC ACID: URIC ACID: 7.9 mg/dL (ref 3.7–8.6)

## 2016-11-30 LAB — PSA: Prostate Specific Ag, Serum: 0.6 ng/mL (ref 0.0–4.0)

## 2016-11-30 LAB — HEPATITIS C ANTIBODY

## 2016-12-17 ENCOUNTER — Other Ambulatory Visit: Payer: Self-pay

## 2016-12-17 ENCOUNTER — Telehealth: Payer: Self-pay

## 2016-12-17 DIAGNOSIS — M25512 Pain in left shoulder: Principal | ICD-10-CM

## 2016-12-17 DIAGNOSIS — G8929 Other chronic pain: Secondary | ICD-10-CM

## 2016-12-17 NOTE — Telephone Encounter (Signed)
Pt called stating Left shoulder pain has gotten worse. Read last visit and saw it was noted to send to ortho if not improved. Sent in a referral order for Ortho.

## 2017-02-18 ENCOUNTER — Ambulatory Visit
Admission: EM | Admit: 2017-02-18 | Discharge: 2017-02-18 | Disposition: A | Discharge status: Home / Self Care | Payer: Managed Care, Other (non HMO) | Attending: Family Medicine | Admitting: Family Medicine

## 2017-02-18 DIAGNOSIS — J0100 Acute maxillary sinusitis, unspecified: Secondary | ICD-10-CM | POA: Diagnosis not present

## 2017-02-18 DIAGNOSIS — R05 Cough: Secondary | ICD-10-CM

## 2017-02-18 MED ORDER — AMOXICILLIN 875 MG PO TABS: 875.0000 mg | ORAL_TABLET | Freq: Two times a day (BID) | ORAL | 0 refills | Status: DC

## 2017-02-18 NOTE — ED Triage Notes (Signed)
Pt c/o sinus pressure, headache and nasal drainage since last Thursday. He tried OTC with no relief such as Claritin D and Flonase.

## 2017-02-18 NOTE — ED Provider Notes (Signed)
[facility]    CSN: [id number] Arrival date & time: 02/18/17  1408     History   Chief Complaint Chief Complaint  Patient presents with  . Sinusitis    HPI Paul Garner is a 61 y.o. male.   The history is provided by the patient.  URI  Presenting symptoms: congestion, cough, facial pain, fatigue, rhinorrhea and sore throat   Severity:  Moderate Onset quality:  Sudden Duration:  6 days Timing:  Constant Progression:  Worsening Chronicity:  New Relieved by:  Nothing Ineffective treatments:  OTC medications Associated symptoms: sinus pain   Associated symptoms: no wheezing   Risk factors: chronic cardiac disease   Risk factors: not elderly, no chronic kidney disease, no chronic respiratory disease, no diabetes mellitus, no immunosuppression, no recent illness, no recent travel and no sick contacts     Past Medical History:  Diagnosis Date  . Coronary artery disease 06/22/2013   Stent placement to the Distal RCA & the mid RCA  . Hyperlipidemia   . PVC's (premature ventricular contractions)     Patient Active Problem List   Diagnosis Date Noted  . Major depressive disorder with single episode, in partial remission ([place]) 11/29/2016  . Basal cell carcinoma, arm 07/24/2016  . Gout 07/24/2016  . Arthritis of both hands 07/24/2016  . Hyperlipidemia   . PVC's (premature ventricular contractions)   . Coronary artery disease 06/22/2013    Past Surgical History:  Procedure Laterality Date  . CARDIAC CATHETERIZATION  06/22/2013  . CORONARY ANGIOPLASTY WITH STENT PLACEMENT  06/22/2013   stent placement in the distal RCA & mid RCA  . INCISION AND DRAINAGE PERIRECTAL ABSCESS N/A 04/01/2016   Procedure: IRRIGATION AND DEBRIDEMENT PERIRECTAL ABSCESS;  Surgeon: Christene Lye, MD;  Location: [facility] ORS;  Service: General;  Laterality: N/A;       Home Medications    Prior to Admission medications   Medication Sig Start Date End Date Taking?  Authorizing Provider  aspirin EC 81 MG tablet Take 1 tablet (81 mg total) by mouth daily. 12/07/15  Yes Deboraha Sprang, MD  diclofenac (VOLTAREN) 75 MG EC tablet Take 75 mg by mouth 2 (two) times daily.    Yes Historical Provider, MD  escitalopram (LEXAPRO) 10 MG tablet Take 1 tablet (10 mg total) by mouth daily. 11/29/16  Yes Glean Hess, MD  rosuvastatin (CRESTOR) 20 MG tablet Take 1 tablet (20 mg total) by mouth daily. 11/11/16  Yes Wellington Hampshire, MD  amoxicillin (AMOXIL) 875 MG tablet Take 1 tablet (875 mg total) by mouth 2 (two) times daily. 02/18/17   Norval Gable, MD    Family History Family History  Problem Relation Age of Onset  . Cancer Mother 75    gyn  . Heart disease Father     CABG x 2   . Heart attack Father 51  . Hyperlipidemia Father     Social History Social History  Substance Use Topics  . Smoking status: Never Smoker  . Smokeless tobacco: Never Used  . Alcohol use Yes     Comment: 5-10 Beers or Glasses of Wine Weekly     Allergies   Patient has no known allergies.   Review of Systems Review of Systems  Constitutional: Positive for fatigue.  HENT: Positive for congestion, rhinorrhea, sinus pain and sore throat.   Respiratory: Positive for cough. Negative for wheezing.      Physical Exam Triage Vital Signs ED Triage Vitals  Enc Vitals  Group     BP 02/18/17 1430 140/84     Pulse Rate 02/18/17 1430 83     Resp 02/18/17 1430 18     Temp 02/18/17 1430 98.2 F (36.8 C)     Temp Source 02/18/17 1430 Oral     SpO2 02/18/17 1430 99 %     Weight 02/18/17 1430 195 lb (88.5 kg)     Height 02/18/17 1430 6' (1.829 m)     Head Circumference --      Peak Flow --      Pain Score 02/18/17 1431 5     Pain Loc --      Pain Edu? --      Excl. in [place]? --    No data found.   Updated Vital Signs BP 140/84 (BP Location: Left Arm)   Pulse 83   Temp 98.2 F (36.8 C) (Oral)   Resp 18   Ht 6' (1.829 m)   Wt 195 lb (88.5 kg)   SpO2 99%   BMI 26.45 kg/m    Visual Acuity Right Eye Distance:   Left Eye Distance:   Bilateral Distance:    Right Eye Near:   Left Eye Near:    Bilateral Near:     Physical Exam  Constitutional: He appears well-developed and well-nourished. No distress.  HENT:  Head: Normocephalic and atraumatic.  Right Ear: External ear and ear canal normal. A middle ear effusion is present.  Left Ear: External ear and ear canal normal. A middle ear effusion is present.  Nose: Mucosal edema and rhinorrhea present.  Mouth/Throat: Uvula is midline and mucous membranes are normal. Posterior oropharyngeal erythema (and postnasal drainage) present. No oropharyngeal exudate, posterior oropharyngeal edema or tonsillar abscesses.  Eyes: Conjunctivae and EOM are normal. Pupils are equal, round, and reactive to light. Right eye exhibits no discharge. Left eye exhibits no discharge. No scleral icterus.  Neck: Normal range of motion. Neck supple. No tracheal deviation present. No thyromegaly present.  Cardiovascular: Normal rate, regular rhythm and normal heart sounds.   Pulmonary/Chest: Effort normal and breath sounds normal. No stridor. No respiratory distress. He has no wheezes. He has no rales. He exhibits no tenderness.  Lymphadenopathy:    He has no cervical adenopathy.  Neurological: He is alert.  Skin: Skin is warm and dry. No rash noted. He is not diaphoretic.  Nursing note and vitals reviewed.    UC Treatments / Results  Labs (all labs ordered are listed, but only abnormal results are displayed) Labs Reviewed - No data to display  EKG  EKG Interpretation None       Radiology No results found.  Procedures Procedures (including critical care time)  Medications Ordered in UC Medications - No data to display   Initial Impression / Assessment and Plan / UC Course  I have reviewed the triage vital signs and the nursing notes.  Pertinent labs & imaging results that were available during my care of the patient  were reviewed by me and considered in my medical decision making (see chart for details).       Final Clinical Impressions(s) / UC Diagnoses   Final diagnoses:  Acute maxillary sinusitis, recurrence not specified    New Prescriptions Discharge Medication List as of 02/18/2017  2:56 PM    START taking these medications   Details  amoxicillin (AMOXIL) 875 MG tablet Take 1 tablet (875 mg total) by mouth 2 (two) times daily., Starting Tue 02/18/2017, Normal  1. diagnosis reviewed with patient 2. rx as per orders above; reviewed possible side effects, interactions, risks and benefits  3. Recommend supportive treatment with otc flonase 4. Follow-up prn if symptoms worsen or don't improve   Norval Gable, MD 02/18/17 1535

## 2017-04-08 ENCOUNTER — Other Ambulatory Visit: Payer: Self-pay | Admitting: Internal Medicine

## 2017-04-08 ENCOUNTER — Telehealth: Payer: Self-pay

## 2017-04-08 MED ORDER — DICLOFENAC SODIUM 75 MG PO TBEC: 75.0000 mg | DELAYED_RELEASE_TABLET | Freq: Two times a day (BID) | ORAL | 5 refills | Status: DC

## 2017-04-08 NOTE — Telephone Encounter (Signed)
Done

## 2017-04-08 NOTE — Telephone Encounter (Signed)
Pt informed

## 2017-04-08 NOTE — Telephone Encounter (Signed)
Pt called stating at previous visit discussed that when meds run out to call office. Needs refill to Diclofenac 75 mg sent to [facility] in [place].

## 2017-06-09 ENCOUNTER — Other Ambulatory Visit: Payer: Self-pay | Admitting: Internal Medicine

## 2017-06-09 DIAGNOSIS — F324 Major depressive disorder, single episode, in partial remission: Secondary | ICD-10-CM

## 2017-06-09 NOTE — Telephone Encounter (Signed)
Called pt to schedule CPE in February.

## 2017-11-04 ENCOUNTER — Other Ambulatory Visit: Payer: Self-pay | Admitting: Cardiovascular Disease

## 2017-11-21 ENCOUNTER — Ambulatory Visit (INDEPENDENT_AMBULATORY_CARE_PROVIDER_SITE_OTHER): Payer: 59 | Admitting: Internal Medicine

## 2017-11-21 ENCOUNTER — Encounter: Payer: Self-pay | Admitting: Internal Medicine

## 2017-11-21 VITALS — BP 132/84 | HR 76 | Temp 98.3°F | Ht 72.0 in | Wt 198.0 lb

## 2017-11-21 DIAGNOSIS — J40 Bronchitis, not specified as acute or chronic: Secondary | ICD-10-CM

## 2017-11-21 MED ORDER — DOXYCYCLINE HYCLATE 100 MG PO TABS: 100.0000 mg | ORAL_TABLET | Freq: Two times a day (BID) | ORAL | 0 refills | Status: AC

## 2017-11-21 MED ORDER — GUAIFENESIN-CODEINE 100-10 MG/5ML PO SYRP: 5.0000 mL | ORAL_SOLUTION | Freq: Three times a day (TID) | ORAL | 0 refills | Status: DC | PRN

## 2017-11-21 NOTE — Progress Notes (Signed)
Date:  11/21/2017   Name:  Paul Garner   DOB:  [birth year]   MRN:  [id number]   Chief Complaint: Cough (Coughing- no production. Sore throat. Wife had this last week. Started a week ago. Chills. ) Cough  This is a new problem. The current episode started in the past 7 days. The problem has been unchanged. The problem occurs every few minutes. The cough is non-productive. Associated symptoms include chills, a fever, postnasal drip and a sore throat. Pertinent negatives include no chest pain, ear pain, headaches or wheezing. The symptoms are aggravated by lying down and exercise. Treatments tried: claritin D.      Review of Systems  Constitutional: Positive for chills and fever.  HENT: Positive for postnasal drip and sore throat. Negative for ear pain.   Eyes: Negative for visual disturbance.  Respiratory: Positive for cough. Negative for wheezing.   Cardiovascular: Negative for chest pain, palpitations and leg swelling.  Gastrointestinal: Negative for diarrhea and vomiting.  Neurological: Negative for dizziness and headaches.    Patient Active Problem List   Diagnosis Date Noted  . Major depressive disorder with single episode, in partial remission ([place]) 11/29/2016  . Basal cell carcinoma, arm 07/24/2016  . Gout 07/24/2016  . Arthritis of both hands 07/24/2016  . Hyperlipidemia   . PVC's (premature ventricular contractions)   . Coronary artery disease 06/22/2013    Prior to Admission medications   Medication Sig Start Date End Date Taking? Authorizing Provider       Norval Gable, MD  aspirin EC 81 MG tablet Take 1 tablet (81 mg total) by mouth daily. 12/07/15   Deboraha Sprang, MD  diclofenac (VOLTAREN) 75 MG EC tablet Take 1 tablet (75 mg total) by mouth 2 (two) times daily. 04/08/17   Glean Hess, MD  escitalopram (LEXAPRO) 10 MG tablet TAKE 1 TABLET (10 MG TOTAL) BY MOUTH DAILY. 06/09/17   Glean Hess, MD  rosuvastatin (CRESTOR) 20 MG tablet TAKE 1 TABLET  (20 MG TOTAL) BY MOUTH DAILY. 11/04/17   Wellington Hampshire, MD    No Known Allergies  Past Surgical History:  Procedure Laterality Date  . CARDIAC CATHETERIZATION  06/22/2013  . CORONARY ANGIOPLASTY WITH STENT PLACEMENT  06/22/2013   stent placement in the distal RCA & mid RCA  . INCISION AND DRAINAGE PERIRECTAL ABSCESS N/A 04/01/2016   Procedure: IRRIGATION AND DEBRIDEMENT PERIRECTAL ABSCESS;  Surgeon: Christene Lye, MD;  Location: [facility] ORS;  Service: General;  Laterality: N/A;    Social History   Tobacco Use  . Smoking status: Never Smoker  . Smokeless tobacco: Never Used  Substance Use Topics  . Alcohol use: Yes    Comment: 5-10 Beers or Glasses of Wine Weekly  . Drug use: No     Medication list has been reviewed and updated.  PHQ 2/9 Scores 11/21/2017 11/29/2016 07/24/2016  PHQ - 2 Score 0 0 2  PHQ- 9 Score 0 - 5    Physical Exam  Constitutional: He is oriented to person, place, and time. He appears well-developed. No distress.  HENT:  Head: Normocephalic and atraumatic.  Right Ear: Tympanic membrane and ear canal normal.  Left Ear: Tympanic membrane and ear canal normal.  Nose: Right sinus exhibits maxillary sinus tenderness. Right sinus exhibits no frontal sinus tenderness. Left sinus exhibits maxillary sinus tenderness. Left sinus exhibits no frontal sinus tenderness.  Mouth/Throat: Posterior oropharyngeal erythema present. No oropharyngeal exudate or posterior oropharyngeal edema.  Cardiovascular: Normal rate,  regular rhythm and normal heart sounds.  Pulmonary/Chest: Effort normal. No respiratory distress. He has decreased breath sounds. He has no wheezes. He has no rhonchi.  Musculoskeletal: Normal range of motion.  Neurological: He is alert and oriented to person, place, and time.  Skin: Skin is warm and dry. No rash noted.  Psychiatric: He has a normal mood and affect. His behavior is normal. Thought content normal.  Nursing note and vitals reviewed.   BP  132/84   Pulse 76   Temp 98.3 F (36.8 C) (Oral)   Ht 6' (1.829 m)   Wt 198 lb (89.8 kg)   SpO2 99%   BMI 26.85 kg/m   Assessment and Plan: 1. Bronchitis Continue Claritin-D, Advil and fluids - doxycycline (VIBRA-TABS) 100 MG tablet; Take 1 tablet (100 mg total) by mouth 2 (two) times daily for 10 days.  Dispense: 20 tablet; Refill: 0 - guaiFENesin-codeine (ROBITUSSIN AC) 100-10 MG/5ML syrup; Take 5 mLs by mouth 3 (three) times daily as needed for cough.  Dispense: 150 mL; Refill: 0   Meds ordered this encounter  Medications  . doxycycline (VIBRA-TABS) 100 MG tablet    Sig: Take 1 tablet (100 mg total) by mouth 2 (two) times daily for 10 days.    Dispense:  20 tablet    Refill:  0  . guaiFENesin-codeine (ROBITUSSIN AC) 100-10 MG/5ML syrup    Sig: Take 5 mLs by mouth 3 (three) times daily as needed for cough.    Dispense:  150 mL    Refill:  0    Partially dictated using Editor, commissioning. Any errors are unintentional.  Halina Maidens, MD [facility]  11/21/2017

## 2017-11-25 ENCOUNTER — Other Ambulatory Visit: Payer: Self-pay

## 2017-11-25 ENCOUNTER — Telehealth: Payer: Self-pay

## 2017-11-25 DIAGNOSIS — F324 Major depressive disorder, single episode, in partial remission: Secondary | ICD-10-CM

## 2017-11-25 MED ORDER — ESCITALOPRAM OXALATE 10 MG PO TABS: 10.0000 mg | ORAL_TABLET | Freq: Every day | ORAL | 1 refills | Status: DC

## 2017-11-25 NOTE — Telephone Encounter (Signed)
Please call patient and schedule physical for 5-6 months out.   Thank you.

## 2017-12-01 ENCOUNTER — Other Ambulatory Visit: Payer: Self-pay

## 2017-12-01 MED ORDER — ROSUVASTATIN CALCIUM 20 MG PO TABS: 20.0000 mg | ORAL_TABLET | Freq: Every day | ORAL | 6 refills | Status: DC

## 2017-12-02 ENCOUNTER — Ambulatory Visit (INDEPENDENT_AMBULATORY_CARE_PROVIDER_SITE_OTHER): Payer: 59 | Admitting: Internal Medicine

## 2017-12-02 ENCOUNTER — Encounter: Payer: Self-pay | Admitting: Internal Medicine

## 2017-12-02 VITALS — BP 122/78 | HR 58 | Ht 72.0 in | Wt 198.0 lb

## 2017-12-02 DIAGNOSIS — E782 Mixed hyperlipidemia: Secondary | ICD-10-CM

## 2017-12-02 DIAGNOSIS — Z0000 Encounter for general adult medical examination without abnormal findings: Secondary | ICD-10-CM

## 2017-12-02 DIAGNOSIS — I2510 Atherosclerotic heart disease of native coronary artery without angina pectoris: Secondary | ICD-10-CM

## 2017-12-02 DIAGNOSIS — Z125 Encounter for screening for malignant neoplasm of prostate: Secondary | ICD-10-CM

## 2017-12-02 DIAGNOSIS — Z0001 Encounter for general adult medical examination with abnormal findings: Secondary | ICD-10-CM | POA: Diagnosis not present

## 2017-12-02 DIAGNOSIS — Z1211 Encounter for screening for malignant neoplasm of colon: Secondary | ICD-10-CM

## 2017-12-02 DIAGNOSIS — F324 Major depressive disorder, single episode, in partial remission: Secondary | ICD-10-CM | POA: Diagnosis not present

## 2017-12-02 LAB — POCT URINALYSIS DIPSTICK
Bilirubin, UA: NEGATIVE
Glucose, UA: NEGATIVE
KETONES UA: NEGATIVE
Leukocytes, UA: NEGATIVE
NITRITE UA: NEGATIVE
PH UA: 5 (ref 5.0–8.0)
PROTEIN UA: NEGATIVE
RBC UA: 5
SPEC GRAV UA: 1.01 (ref 1.010–1.025)
UROBILINOGEN UA: 0.2 U/dL

## 2017-12-02 NOTE — Patient Instructions (Signed)
Schedule a Dermatology skin evaluation   Health Maintenance, Male A healthy lifestyle and preventive care is important for your health and wellness. Ask your health care provider about what schedule of regular examinations is right for you. What should I know about weight and diet? Eat a Healthy Diet  Eat plenty of vegetables, fruits, whole grains, low-fat dairy products, and lean protein.  Do not eat a lot of foods high in solid fats, added sugars, or salt.  Maintain a Healthy Weight Regular exercise can help you achieve or maintain a healthy weight. You should:  Do at least 150 minutes of exercise each week. The exercise should increase your heart rate and make you sweat (moderate-intensity exercise).  Do strength-training exercises at least twice a week.  Watch Your Levels of Cholesterol and Blood Lipids  Have your blood tested for lipids and cholesterol every 5 years starting at 60 years of age. If you are at high risk for heart disease, you should start having your blood tested when you are 60 years old. You may need to have your cholesterol levels checked more often if: ? Your lipid or cholesterol levels are high. ? You are older than 59 years of age. ? You are at high risk for heart disease.  What should I know about cancer screening? Many types of cancers can be detected early and may often be prevented. Lung Cancer  You should be screened every year for lung cancer if: ? You are a current smoker who has smoked for at least 30 years. ? You are a former smoker who has quit within the past 15 years.  Talk to your health care provider about your screening options, when you should start screening, and how often you should be screened.  Colorectal Cancer  Routine colorectal cancer screening usually begins at 60 years of age and should be repeated every 5-10 years until you are 60 years old. You may need to be screened more often if early forms of precancerous polyps or small  growths are found. Your health care provider may recommend screening at an earlier age if you have risk factors for colon cancer.  Your health care provider may recommend using home test kits to check for hidden blood in the stool.  A small camera at the end of a tube can be used to examine your colon (sigmoidoscopy or colonoscopy). This checks for the earliest forms of colorectal cancer.  Prostate and Testicular Cancer  Depending on your age and overall health, your health care provider may do certain tests to screen for prostate and testicular cancer.  Talk to your health care provider about any symptoms or concerns you have about testicular or prostate cancer.  Skin Cancer  Check your skin from head to toe regularly.  Tell your health care provider about any new moles or changes in moles, especially if: ? There is a change in a mole's size, shape, or color. ? You have a mole that is larger than a pencil eraser.  Always use sunscreen. Apply sunscreen liberally and repeat throughout the day.  Protect yourself by wearing long sleeves, pants, a wide-brimmed hat, and sunglasses when outside.  What should I know about heart disease, diabetes, and high blood pressure?  If you are 24-61 years of age, have your blood pressure checked every 3-5 years. If you are 63 years of age or older, have your blood pressure checked every year. You should have your blood pressure measured twice-once when you are at  a hospital or clinic, and once when you are not at a hospital or clinic. Record the average of the two measurements. To check your blood pressure when you are not at a hospital or clinic, you can use: ? An automated blood pressure machine at a pharmacy. ? A home blood pressure monitor.  Talk to your health care provider about your target blood pressure.  If you are between 58-39 years old, ask your health care provider if you should take aspirin to prevent heart disease.  Have regular  diabetes screenings by checking your fasting blood sugar level. ? If you are at a normal weight and have a low risk for diabetes, have this test once every three years after the age of 34. ? If you are overweight and have a high risk for diabetes, consider being tested at a younger age or more often.  A one-time screening for abdominal aortic aneurysm (AAA) by ultrasound is recommended for men aged 53-75 years who are current or former smokers. What should I know about preventing infection? Hepatitis B If you have a higher risk for hepatitis B, you should be screened for this virus. Talk with your health care provider to find out if you are at risk for hepatitis B infection. Hepatitis C Blood testing is recommended for:  Everyone born from [birth year] through 1963.  Anyone with known risk factors for hepatitis C.  Sexually Transmitted Diseases (STDs)  You should be screened each year for STDs including gonorrhea and chlamydia if: ? You are sexually active and are younger than 60 years of age. ? You are older than 60 years of age and your health care provider tells you that you are at risk for this type of infection. ? Your sexual activity has changed since you were last screened and you are at an increased risk for chlamydia or gonorrhea. Ask your health care provider if you are at risk.  Talk with your health care provider about whether you are at high risk of being infected with HIV. Your health care provider may recommend a prescription medicine to help prevent HIV infection.  What else can I do?  Schedule regular health, dental, and eye exams.  Stay current with your vaccines (immunizations).  Do not use any tobacco products, such as cigarettes, chewing tobacco, and e-cigarettes. If you need help quitting, ask your health care provider.  Limit alcohol intake to no more than 2 drinks per day. One drink equals 12 ounces of beer, 5 ounces of wine, or 1 ounces of hard liquor.  Do not use  street drugs.  Do not share needles.  Ask your health care provider for help if you need support or information about quitting drugs.  Tell your health care provider if you often feel depressed.  Tell your health care provider if you have ever been abused or do not feel safe at home. This information is not intended to replace advice given to you by your health care provider. Make sure you discuss any questions you have with your health care provider. Document Released: 04/11/2008 Document Revised: 06/12/2016 Document Reviewed: 07/18/2015 Elsevier Interactive Patient Education  [employer].

## 2017-12-02 NOTE — Progress Notes (Signed)
Date:  12/02/2017   Name:  Paul Garner   DOB:  [birth year]   MRN:  [id number]   Chief Complaint: Annual Exam Paul Garner is a 62 y.o. male who presents today for his Complete Annual Exam. He feels well. He reports exercising rarely. He reports he is sleeping well. He is due for screening colonoscopy when he turns 66.  The last was done in [place], [place] in 2009.  Hyperlipidemia  This is a chronic problem. The problem is controlled. Pertinent negatives include no chest pain, myalgias or shortness of breath. Current antihyperlipidemic treatment includes statins. The current treatment provides significant improvement of lipids.  Heart Problem  This is a chronic problem. The problem has been resolved. Associated symptoms include arthralgias (hands and finger, shoulder). Pertinent negatives include no abdominal pain, chest pain, chills, diaphoresis, fatigue, headaches, myalgias or rash. Treatments tried: s/p PTCA and stent. The treatment provided significant (He sees Cardiology annually) relief.  Depression         This is a chronic problem.  The problem occurs rarely.  Associated symptoms include no fatigue, no appetite change, no myalgias and no headaches.  Past treatments include SSRIs - Selective serotonin reuptake inhibitors.  Compliance with treatment is good. Arthritis - mostly in hands and fingers.  He takes one Voltaren per day and a second one on occasion if needed.  No reflux or abdominal pain noted.    Review of Systems  Constitutional: Negative for appetite change, chills, diaphoresis, fatigue and unexpected weight change.  HENT: Negative for hearing loss, tinnitus, trouble swallowing and voice change.   Eyes: Negative for visual disturbance.  Respiratory: Negative for choking, shortness of breath and wheezing.   Cardiovascular: Negative for chest pain, palpitations and leg swelling.  Gastrointestinal: Negative for abdominal pain, blood in stool, constipation and  diarrhea.  Genitourinary: Negative for difficulty urinating, dysuria and frequency.       Nocturia x 2  Musculoskeletal: Positive for arthralgias (hands and finger, shoulder). Negative for back pain and myalgias.  Skin: Negative for color change and rash.  Allergic/Immunologic: Negative for environmental allergies.  Neurological: Negative for dizziness, syncope and headaches.  Hematological: Negative for adenopathy.  Psychiatric/Behavioral: Positive for depression. Negative for dysphoric mood and sleep disturbance.    Patient Active Problem List   Diagnosis Date Noted  . Major depressive disorder with single episode, in partial remission ([place]) 11/29/2016  . Basal cell carcinoma, arm 07/24/2016  . Gout 07/24/2016  . Arthritis of both hands 07/24/2016  . Hyperlipidemia   . PVC's (premature ventricular contractions)   . Coronary artery disease 06/22/2013    Prior to Admission medications   Medication Sig Start Date End Date Taking? Authorizing Provider  aspirin EC 81 MG tablet Take 1 tablet (81 mg total) by mouth daily. 12/07/15  Yes Deboraha Sprang, MD  diclofenac (VOLTAREN) 75 MG EC tablet Take 1 tablet (75 mg total) by mouth 2 (two) times daily. 04/08/17  Yes Glean Hess, MD  escitalopram (LEXAPRO) 10 MG tablet Take 1 tablet (10 mg total) by mouth daily. 11/25/17  Yes Glean Hess, MD  rosuvastatin (CRESTOR) 20 MG tablet Take 1 tablet (20 mg total) by mouth daily. 12/01/17  Yes Wellington Hampshire, MD    No Known Allergies  Past Surgical History:  Procedure Laterality Date  . CARDIAC CATHETERIZATION  06/22/2013  . CORONARY ANGIOPLASTY WITH STENT PLACEMENT  06/22/2013   stent placement in the distal RCA & mid RCA  .  INCISION AND DRAINAGE PERIRECTAL ABSCESS N/A 04/01/2016   Procedure: IRRIGATION AND DEBRIDEMENT PERIRECTAL ABSCESS;  Surgeon: Christene Lye, MD;  Location: [facility] ORS;  Service: General;  Laterality: N/A;    Social History   Tobacco Use  . Smoking status:  Never Smoker  . Smokeless tobacco: Never Used  Substance Use Topics  . Alcohol use: Yes    Comment: 5-10 Beers or Glasses of Wine Weekly  . Drug use: No     Medication list has been reviewed and updated.  PHQ 2/9 Scores 12/02/2017 11/21/2017 11/29/2016 07/24/2016  PHQ - 2 Score 0 0 0 2  PHQ- 9 Score 0 0 - 5    Physical Exam  Constitutional: He is oriented to person, place, and time. He appears well-developed and well-nourished.  HENT:  Head: Normocephalic.  Right Ear: Tympanic membrane, external ear and ear canal normal.  Left Ear: Tympanic membrane, external ear and ear canal normal.  Nose: Nose normal.  Mouth/Throat: Uvula is midline and oropharynx is clear and moist.  Eyes: Conjunctivae and EOM are normal. Pupils are equal, round, and reactive to light.  Neck: Normal range of motion. Neck supple. Carotid bruit is not present. No thyromegaly present.  Cardiovascular: Normal rate, regular rhythm, normal heart sounds and intact distal pulses.  Pulmonary/Chest: Effort normal and breath sounds normal. He has no wheezes. Right breast exhibits no mass. Left breast exhibits no mass.  Abdominal: Soft. Normal appearance and bowel sounds are normal. There is no hepatosplenomegaly. There is no tenderness.  Musculoskeletal: Normal range of motion. He exhibits no edema or tenderness.  Lymphadenopathy:    He has no cervical adenopathy.  Neurological: He is alert and oriented to person, place, and time. He has normal reflexes.  Skin: Skin is warm, dry and intact.  Psychiatric: He has a normal mood and affect. His speech is normal and behavior is normal. Judgment and thought content normal.  Nursing note and vitals reviewed.   BP 122/78   Pulse (!) 58   Ht 6' (1.829 m)   Wt 198 lb (89.8 kg)   SpO2 99%   BMI 26.85 kg/m   Assessment and Plan: 1. Annual physical exam Normal exam Recommend regular exercise - POCT urinalysis dipstick  2. Prostate cancer screening DRE deferred - PSA  3.  Coronary artery disease involving native coronary artery of native heart without angina pectoris Followed by Cardiology Doing well without sx - CBC with Differential/Platelet  4. Mixed hyperlipidemia On statin therapy - Comprehensive metabolic panel - Lipid panel  5. Major depressive disorder with single episode, in partial remission (HCC) stable - TSH  6. Colon cancer screening - Ambulatory referral to Gastroenterology   No orders of the defined types were placed in this encounter.   Partially dictated using Editor, commissioning. Any errors are unintentional.  Halina Maidens, MD [facility]  12/02/2017

## 2017-12-03 LAB — CBC WITH DIFFERENTIAL/PLATELET
Basophils Absolute: 0.1 10*3/uL (ref 0.0–0.2)
Basos: 1 %
EOS (ABSOLUTE): 0.2 10*3/uL (ref 0.0–0.4)
EOS: 3 %
HEMATOCRIT: 45.9 % (ref 37.5–51.0)
HEMOGLOBIN: 15.4 g/dL (ref 13.0–17.7)
IMMATURE GRANS (ABS): 0.2 10*3/uL — AB (ref 0.0–0.1)
IMMATURE GRANULOCYTES: 2 %
Lymphocytes Absolute: 2.6 10*3/uL (ref 0.7–3.1)
Lymphs: 33 %
MCH: 31.2 pg (ref 26.6–33.0)
MCHC: 33.6 g/dL (ref 31.5–35.7)
MCV: 93 fL (ref 79–97)
MONOCYTES: 6 %
MONOS ABS: 0.5 10*3/uL (ref 0.1–0.9)
NEUTROS PCT: 55 %
Neutrophils Absolute: 4.2 10*3/uL (ref 1.4–7.0)
Platelets: 269 10*3/uL (ref 150–379)
RBC: 4.94 x10E6/uL (ref 4.14–5.80)
RDW: 13.8 % (ref 12.3–15.4)
WBC: 7.7 10*3/uL (ref 3.4–10.8)

## 2017-12-03 LAB — COMPREHENSIVE METABOLIC PANEL
ALT: 34 IU/L (ref 0–44)
AST: 23 IU/L (ref 0–40)
Albumin/Globulin Ratio: 2.1 (ref 1.2–2.2)
Albumin: 4.9 g/dL (ref 3.5–5.5)
Alkaline Phosphatase: 71 IU/L (ref 39–117)
BUN/Creatinine Ratio: 17 (ref 9–20)
BUN: 18 mg/dL (ref 6–24)
Bilirubin Total: 0.3 mg/dL (ref 0.0–1.2)
CALCIUM: 10.2 mg/dL (ref 8.7–10.2)
CO2: 22 mmol/L (ref 20–29)
Chloride: 104 mmol/L (ref 96–106)
Creatinine, Ser: 1.04 mg/dL (ref 0.76–1.27)
GFR calc Af Amer: 90 mL/min/{1.73_m2} (ref >59)
GFR, EST NON AFRICAN AMERICAN: 78 mL/min/{1.73_m2} (ref >59)
GLOBULIN, TOTAL: 2.3 g/dL (ref 1.5–4.5)
Glucose: 96 mg/dL (ref 65–99)
Potassium: 4.5 mmol/L (ref 3.5–5.2)
SODIUM: 140 mmol/L (ref 134–144)
TOTAL PROTEIN: 7.2 g/dL (ref 6.0–8.5)

## 2017-12-03 LAB — TSH: TSH: 2.03 u[IU]/mL (ref 0.450–4.500)

## 2017-12-03 LAB — LIPID PANEL
CHOL/HDL RATIO: 3.2 ratio (ref 0.0–5.0)
Cholesterol, Total: 157 mg/dL (ref 100–199)
HDL: 49 mg/dL (ref >39)
LDL CALC: 71 mg/dL (ref 0–99)
TRIGLYCERIDES: 187 mg/dL — AB (ref 0–149)
VLDL Cholesterol Cal: 37 mg/dL (ref 5–40)

## 2017-12-03 LAB — PSA: Prostate Specific Ag, Serum: 0.8 ng/mL (ref 0.0–4.0)

## 2017-12-10 ENCOUNTER — Encounter: Payer: Self-pay | Admitting: *Deleted

## 2018-01-06 ENCOUNTER — Encounter: Payer: Self-pay | Admitting: Cardiovascular Disease

## 2018-01-06 ENCOUNTER — Ambulatory Visit (INDEPENDENT_AMBULATORY_CARE_PROVIDER_SITE_OTHER): Payer: 59 | Admitting: Cardiovascular Disease

## 2018-01-06 VITALS — BP 120/80 | HR 62 | Ht 72.0 in | Wt 197.5 lb

## 2018-01-06 DIAGNOSIS — I2510 Atherosclerotic heart disease of native coronary artery without angina pectoris: Secondary | ICD-10-CM | POA: Diagnosis not present

## 2018-01-06 DIAGNOSIS — E782 Mixed hyperlipidemia: Secondary | ICD-10-CM | POA: Diagnosis not present

## 2018-01-06 MED ORDER — ICOSAPENT ETHYL 1 G PO CAPS: 2.0000 | ORAL_CAPSULE | Freq: Two times a day (BID) | ORAL | 5 refills | Status: DC

## 2018-01-06 NOTE — Patient Instructions (Addendum)
Medication Instructions:  Your physician has recommended you make the following change in your medication:  START taking vascepa 2 capsules (2grams) twice daily    Labwork: Fasting lipid and liver profile in 6 weeks at the [facility]. No appointment needed. Nothing to eat or drink after midnight the evening before.   Testing/Procedures: none  Follow-Up: Your physician wants you to follow-up in: 1 year with Dr. Zannie Cove will receive a reminder letter in the mail two months in advance. If you don't receive a letter, please call our office to schedule the follow-up appointment.   Any Other Special Instructions Will Be Listed Below (If Applicable).     If you need a refill on your cardiac medications before your next appointment, please call your pharmacy.  Icosapent ethyl capsules What is this medicine? ICOSAPENT ETHYL (eye KOE sa pent eth il) contains essential fats. It is used to treat high triglyceride levels. This medicine may be used for other purposes; ask your health care provider or pharmacist if you have questions. COMMON BRAND NAME(S): VASCEPA What should I tell my health care provider before I take this medicine? They need to know if you have any of these conditions: -bleeding disorders -liver disease -an unusual or allergic reaction to icosapent ethyl, fish, shellfish, other medicines, foods, dyes, or preservatives -pregnant or trying to get pregnant -breast-feeding How should I use this medicine? Take this medicine by mouth with a glass of water. Follow the directions on the prescription label. Take this medicine with food. Do not cut, crush or chew this medicine. Take your medicine at regular intervals. Do not take it more often than directed. Do not stop taking except on your doctor's advice. Talk to your pediatrician regarding the use of this medicine in children. Special care may be needed. Overdosage: If you think you have taken too much of this medicine contact  a poison control center or emergency room at once. NOTE: This medicine is only for you. Do not share this medicine with others. What if I miss a dose? If you miss a dose, take it as soon as you can. If it is almost time for your next dose, take only that dose. Do not take double or extra doses. What may interact with this medicine? This medicine may interact with the following medications: -aspirin and aspirin-like medicines -beta-blockers like metoprolol and propranolol -certain medicines that treat or prevent blood clots like warfarin, enoxaparin, dalteparin, apixaban, dabigatran, and rivaroxaban -diuretics -male hormones, like estrogens and birth control pills This list may not describe all possible interactions. Give your health care provider a list of all the medicines, herbs, non-prescription drugs, or dietary supplements you use. Also tell them if you smoke, drink alcohol, or use illegal drugs. Some items may interact with your medicine. What should I watch for while using this medicine? You may need blood work done while you are taking this medicine. Follow a good diet and exercise plan. Taking this medicine does not replace a healthy lifestyle. Some foods that have omega-3 fatty acids naturally are fatty fish like albacore tuna, halibut, herring, mackerel, lake trout, salmon, and sardines. If you are scheduled for any medical or dental procedure, tell your healthcare provider that you are taking this medicine. You may need to stop taking this medicine before the procedure. What side effects may I notice from receiving this medicine? Side effects that you should report to your doctor or health care professional as soon as possible: -allergic reactions like skin rash,  itching or hives, swelling of the face, lips, or tongue -breathing problems -unusual bleeding or bruising Side effects that usually do not require medical attention (report to your doctor or health care professional if  they continue or are bothersome): -joint pain -sore throat This list may not describe all possible side effects. Call your doctor for medical advice about side effects. You may report side effects to FDA at 1-800-FDA-1088. Where should I keep my medicine? Keep out of the reach of children. Store at room temperature between 15 and 30 degrees C (59 and 86 degrees F). Throw away any unused medicine after the expiration date. NOTE: This sheet is a summary. It may not cover all possible information. If you have questions about this medicine, talk to your doctor, pharmacist, or health care provider.  2018 Elsevier/Gold Standard (2015-11-16 13:40:36)

## 2018-01-06 NOTE — Progress Notes (Signed)
Cardiology Office Note   Date:  01/06/2018   ID:  Paul Garner, DOB [birth year], MRN [id number]  PCP:  Glean Hess, MD  Cardiologist:   Kathlyn Sacramento, MD   Chief Complaint  Patient presents with  . other    12 month follow Meds reviewed by the pt. verbally. "doing well."       History of Present Illness: Paul Garner is a 62 y.o. male who is here today for a follow-up visit regarding coronary artery disease and PVCs. He has known history of coronary artery disease with previous stenting of the right coronary artery in 2014 for stable angina. No previous myocardial infarction. Other medical problems include PVCs, hyperlipidemia and gout. He is a lifelong nonsmoker. He has extensive family history of coronary artery disease.   He has been doing reasonably well with no chest pain, shortness of breath or palpitations.  No side effects with medications.   Past Medical History:  Diagnosis Date  . Coronary artery disease 06/22/2013   Stent placement to the Distal RCA & the mid RCA  . Hyperlipidemia   . PVC's (premature ventricular contractions)     Past Surgical History:  Procedure Laterality Date  . CARDIAC CATHETERIZATION  06/22/2013  . CORONARY ANGIOPLASTY WITH STENT PLACEMENT  06/22/2013   stent placement in the distal RCA & mid RCA  . INCISION AND DRAINAGE PERIRECTAL ABSCESS N/A 04/01/2016   Procedure: IRRIGATION AND DEBRIDEMENT PERIRECTAL ABSCESS;  Surgeon: Christene Lye, MD;  Location: [facility] ORS;  Service: General;  Laterality: N/A;     Current Outpatient Medications  Medication Sig Dispense Refill  . aspirin EC 81 MG tablet Take 1 tablet (81 mg total) by mouth daily.    . diclofenac (VOLTAREN) 75 MG EC tablet Take 1 tablet (75 mg total) by mouth 2 (two) times daily. 60 tablet 5  . escitalopram (LEXAPRO) 10 MG tablet Take 1 tablet (10 mg total) by mouth daily. 90 tablet 1  . rosuvastatin (CRESTOR) 20 MG tablet Take 1 tablet (20 mg total) by mouth  daily. 30 tablet 6   No current facility-administered medications for this visit.     Allergies:   Patient has no known allergies.    Social History:  The patient  reports that  has never smoked. he has never used smokeless tobacco. He reports that he drinks alcohol. He reports that he does not use drugs.   Family History:  The patient's family history includes Cancer (age of onset: 6) in his mother; Heart attack (age of onset: 33) in his father; Heart disease in his father; Hyperlipidemia in his father.    ROS:  Please see the history of present illness.   Otherwise, review of systems are positive for none.   All other systems are reviewed and negative.    PHYSICAL EXAM: VS:  BP 120/80 (BP Location: Left Arm, Patient Position: Sitting, Cuff Size: Normal)   Pulse 62   Ht 6' (1.829 m)   Wt 197 lb 8 oz (89.6 kg)   BMI 26.79 kg/m  , BMI Body mass index is 26.79 kg/m. GEN: Well nourished, well developed, in no acute distress  HEENT: normal  Neck: no JVD, carotid bruits, or masses Cardiac: RRR; no murmurs, rubs, or gallops,no edema  Respiratory:  clear to auscultation bilaterally, normal work of breathing GI: soft, nontender, nondistended, + BS MS: no deformity or atrophy  Skin: warm and dry, no rash Neuro:  Strength and sensation are intact Psych:  euthymic mood, full affect   EKG:  EKG is ordered today. EKG shows normal sinus rhythm with no significant ST or T wave changes.   Recent Labs: 12/02/2017: ALT 34; BUN 18; Creatinine, Ser 1.04; Hemoglobin 15.4; Platelets 269; Potassium 4.5; Sodium 140; TSH 2.030    Lipid Panel    Component Value Date/Time   CHOL 157 12/02/2017 1011   TRIG 187 (H) 12/02/2017 1011   HDL 49 12/02/2017 1011   CHOLHDL 3.2 12/02/2017 1011   CHOLHDL 3.3 12/12/2015 1007   VLDL 29 12/12/2015 1007   LDLCALC 71 12/02/2017 1011      Wt Readings from Last 3 Encounters:  01/06/18 197 lb 8 oz (89.6 kg)  12/02/17 198 lb (89.8 kg)  11/21/17 198 lb  (89.8 kg)        ASSESSMENT AND PLAN:  1.  Coronary artery disease involving native coronary arteries without angina: He is overall doing well with no anginal symptoms. Continue low-dose aspirin.   2. Hyperlipidemia: I reviewed recent labs done in February which overall were unremarkable.  Lipid profile showed a triglyceride of 187, HDL of 49 and an LDL of 71.  His LDL is close to target on current dose of rosuvastatin.  However, his triglyceride continues to be elevated.   I discussed with him the recent significant findings with Vascepa and elected to start this medication at 2 g twice daily.  Repeat labs in 6 weeks.  3. PVCs: The burden was not significant enough to require treatment.  His palpitations are very mild.   Disposition:   FU with me in 12 months  Signed,  Kathlyn Sacramento, MD  01/06/2018 3:31 PM    [place]

## 2018-01-19 ENCOUNTER — Other Ambulatory Visit: Payer: Self-pay

## 2018-01-19 MED ORDER — DICLOFENAC SODIUM 75 MG PO TBEC: 75.0000 mg | DELAYED_RELEASE_TABLET | Freq: Two times a day (BID) | ORAL | 1 refills | Status: DC

## 2018-02-26 ENCOUNTER — Telehealth: Payer: Self-pay | Admitting: Cardiovascular Disease

## 2018-02-26 NOTE — Telephone Encounter (Signed)
Pt c/o of Chest Pain: STAT if CP now or developed within 24 hours  1. Are you having CP right now? Mild tightness   2. Are you experiencing any other symptoms (ex. SOB, nausea, vomiting, sweating)? Denies other symptoms   3. How long have you been experiencing CP? 2 weeks   4. Is your CP continuous or coming and going? Comes and goes   5. Have you taken Nitroglycerin? No  ?

## 2018-02-26 NOTE — Telephone Encounter (Signed)
I left a message for the patient to call. 

## 2018-02-27 NOTE — Telephone Encounter (Signed)
Patient returning call.

## 2018-02-27 NOTE — Telephone Encounter (Signed)
Pt is returning your call

## 2018-02-27 NOTE — Telephone Encounter (Signed)
No answer. Left message to call back.   

## 2018-02-27 NOTE — Telephone Encounter (Signed)
S/w patient.  He's been having chest tightness which starts in the mid to left side of chest to the back. It comes off and on throughout the day. He saw Dr Fletcher Anon 01/06/18 and was not having the pain at that time. Denies shortness of breath, nausea, vomiting, or sweating. Tried taking Zantac for the past 4 days and if has not helped. BP runs 110-120's/70's. Discussed with Ignacia Bayley, NP. He advised ok to add patient to schedule on 03/04/18 at 0900 but if patient's chest pain recurs again to proceed to ER for evaluation. Patient verbalized understanding of appointment date and time and to go to ED if chest pain comes back anytime before appointment.

## 2018-03-04 ENCOUNTER — Encounter: Payer: Self-pay | Admitting: Nurse Practitioner

## 2018-03-04 ENCOUNTER — Ambulatory Visit (INDEPENDENT_AMBULATORY_CARE_PROVIDER_SITE_OTHER): Payer: 59 | Admitting: Nurse Practitioner

## 2018-03-04 VITALS — BP 110/68 | HR 64 | Ht 71.0 in | Wt 196.5 lb

## 2018-03-04 DIAGNOSIS — I493 Ventricular premature depolarization: Secondary | ICD-10-CM

## 2018-03-04 DIAGNOSIS — E785 Hyperlipidemia, unspecified: Secondary | ICD-10-CM

## 2018-03-04 DIAGNOSIS — R079 Chest pain, unspecified: Secondary | ICD-10-CM

## 2018-03-04 DIAGNOSIS — I25119 Atherosclerotic heart disease of native coronary artery with unspecified angina pectoris: Secondary | ICD-10-CM | POA: Diagnosis not present

## 2018-03-04 NOTE — Patient Instructions (Addendum)
Medication Instructions: - Your physician recommends that you continue on your current medications as directed. Please refer to the Current Medication list given to you today.  Labwork: - none ordered  Procedures/Testing: - Your physician has requested that you have an exercise stress myoview.  [place]  Your caregiver has ordered a Stress Test with nuclear imaging. The purpose of this test is to evaluate the blood supply to your heart muscle. This procedure is referred to as a "Non-Invasive Stress Test." This is because other than having an IV started in your vein, nothing is inserted or "invades" your body. Cardiac stress tests are done to find areas of poor blood flow to the heart by determining the extent of coronary artery disease (CAD). Some patients exercise on a treadmill, which naturally increases the blood flow to your heart, while others who are  unable to walk on a treadmill due to physical limitations have a pharmacologic/chemical stress agent called Lexiscan . This medicine will mimic walking on a treadmill by temporarily increasing your coronary blood flow.   Please note: these test may take anywhere between 2-4 hours to complete  PLEASE REPORT TO [facility] AT THE FIRST DESK WILL DIRECT YOU WHERE TO GO  Date of Procedure:_____________________________________  Arrival Time for Procedure:______________________________  Instructions regarding medication:   ____:  You may take all of your regular medications the morning of your test with enough water to get them down safely  PLEASE NOTIFY THE OFFICE AT LEAST 24 HOURS IN ADVANCE IF YOU ARE UNABLE TO [place].  [phone] AND  PLEASE NOTIFY NUCLEAR MEDICINE AT [facility] AT LEAST 24 HOURS IN ADVANCE IF YOU ARE UNABLE TO KEEP YOUR APPOINTMENT. [phone]  How to prepare for your Myoview test:  1. Do not eat or drink after midnight 2. No caffeine for 24 hours prior to test 3. No  smoking 24 hours prior to test. 4. Your medication may be taken with water.  If your doctor stopped a medication because of this test, do not take that medication. 5. Ladies, please do not wear dresses.  Skirts or pants are appropriate. Please wear a short sleeve shirt. 6. No perfume, cologne or lotion. 7. Wear comfortable walking shoes. No heels!  Follow-Up: - Your physician recommends that you schedule a follow-up appointment in: September with Dr. Fletcher Anon.   Any Additional Special Instructions Will Be Listed Below (If Applicable).     If you need a refill on your cardiac medications before your next appointment, please call your pharmacy.    Cardiac Nuclear Scan A cardiac nuclear scan is a test that measures blood flow to the heart when a person is resting and when he or she is exercising. The test looks for problems such as:  Not enough blood reaching a portion of the heart.  The heart muscle not working normally.  You may need this test if:  You have heart disease.  You have had abnormal lab results.  You have had heart surgery or angioplasty.  You have chest pain.  You have shortness of breath.  In this test, a radioactive dye (tracer) is injected into your bloodstream. After the tracer has traveled to your heart, an imaging device is used to measure how much of the tracer is absorbed by or distributed to various areas of your heart. This procedure is usually done at a hospital and takes 2-4 hours. Tell a health care provider about:  Any allergies you have.  All medicines you are taking, including vitamins, herbs, eye drops, creams, and over-the-counter medicines.  Any problems you or family members have had with the use of anesthetic medicines.  Any blood disorders you have.  Any surgeries you have had.  Any medical conditions you have.  Whether you are pregnant or may be pregnant. What are the risks? Generally, this is a safe procedure. However, problems  may occur, including:  Serious chest pain and heart attack. This is only a risk if the stress portion of the test is done.  Rapid heartbeat.  Sensation of warmth in your chest. This usually passes quickly.  What happens before the procedure?  Ask your health care provider about changing or stopping your regular medicines. This is especially important if you are taking diabetes medicines or blood thinners.  Remove your jewelry on the day of the procedure. What happens during the procedure?  An IV tube will be inserted into one of your veins.  Your health care provider will inject a small amount of radioactive tracer through the tube.  You will wait for 20-40 minutes while the tracer travels through your bloodstream.  Your heart activity will be monitored with an electrocardiogram (ECG).  You will lie down on an exam table.  Images of your heart will be taken for about 15-20 minutes.  You may be asked to exercise on a treadmill or stationary bike. While you exercise, your heart's activity will be monitored with an ECG, and your blood pressure will be checked. If you are unable to exercise, you may be given a medicine to increase blood flow to parts of your heart.  When blood flow to your heart has peaked, a tracer will again be injected through the IV tube.  After 20-40 minutes, you will get back on the exam table and have more images taken of your heart.  When the procedure is over, your IV tube will be removed. The procedure may vary among health care providers and hospitals. Depending on the type of tracer used, scans may need to be repeated 3-4 hours later. What happens after the procedure?  Unless your health care provider tells you otherwise, you may return to your normal schedule, including diet, activities, and medicines.  Unless your health care provider tells you otherwise, you may increase your fluid intake. This will help flush the contrast dye from your body. Drink  enough fluid to keep your urine clear or pale yellow.  It is up to you to get your test results. Ask your health care provider, or the department that is doing the test, when your results will be ready. Summary  A cardiac nuclear scan measures the blood flow to the heart when a person is resting and when he or she is exercising.  You may need this test if you are at risk for heart disease.  Tell your health care provider if you are pregnant.  Unless your health care provider tells you otherwise, increase your fluid intake. This will help flush the contrast dye from your body. Drink enough fluid to keep your urine clear or pale yellow. This information is not intended to replace advice given to you by your health care provider. Make sure you discuss any questions you have with your health care provider. Document Released: 11/08/2004 Document Revised: 10/16/2016 Document Reviewed: 09/22/2013 Elsevier Interactive Patient Education  2017 [employer].

## 2018-03-04 NOTE — Progress Notes (Signed)
Office Visit    Patient Name: Paul Garner Date of Encounter: 03/04/2018  Primary Care Provider:  Glean Hess, MD Primary Cardiologist:  Kathlyn Sacramento, MD  Chief Complaint    62 y/o ? with a h/o CAD, HL, and PVCs, who presents for f/u related intermittent upper abd and chest pain over the past 3-4 wks.  Past Medical History    Past Medical History:  Diagnosis Date  . Coronary artery disease    a. 05/2013 s/p PCI/DES to the mid/distal RCA;  b. 11/2015 MV: EF 51%, no ischemia/infarct.  . Hyperlipidemia   . PVC's (premature ventricular contractions)    Past Surgical History:  Procedure Laterality Date  . CARDIAC CATHETERIZATION  06/22/2013  . CORONARY ANGIOPLASTY WITH STENT PLACEMENT  06/22/2013   stent placement in the distal RCA & mid RCA  . INCISION AND DRAINAGE PERIRECTAL ABSCESS N/A 04/01/2016   Procedure: IRRIGATION AND DEBRIDEMENT PERIRECTAL ABSCESS;  Surgeon: Christene Lye, MD;  Location: [facility] ORS;  Service: General;  Laterality: N/A;    Allergies  No Known Allergies  History of Present Illness    62 year old male with the above past medical history including CAD status post PCI and drug-eluting stent placement to the mid and distal RCA in August 2014 in the setting of stable angina.  Anginal equivalent at that time was exertional right-sided neck discomfort and dyspnea.  He also has a history of hyperlipidemia and PVCs.  He had a negative stress test in February 2017.  He was last seen in clinic in March, at which time he was doing well.  He says that over the past 3 to 4 weeks, he has experienced intermittent, mild upper abdominal and retrosternal chest discomfort, typically occurring immediately after awakening, without associated symptoms, lasting between 30 minutes and 90 minutes, and resolving spontaneously.  Symptoms seem to improve with exertion.  Symptoms did not limit his activity at all and in fact, he is quite busy on the weekends, working in  the yard and refurbishing a home.  He has had no symptoms during those activities.  Over the past week, symptoms are less likely to occur.  Of note, he was placed on vascepa at his last clinic visit.  Shortly after starting, symptoms started and he held it.  He denies PND, orthopnea, dizziness, syncope, edema, or early satiety.  Home Medications    Prior to Admission medications   Medication Sig Start Date End Date Taking? Authorizing Provider  aspirin EC 81 MG tablet Take 1 tablet (81 mg total) by mouth daily. 12/07/15  Yes Deboraha Sprang, MD  diclofenac (VOLTAREN) 75 MG EC tablet Take 1 tablet (75 mg total) by mouth 2 (two) times daily. 01/19/18  Yes Glean Hess, MD  escitalopram (LEXAPRO) 10 MG tablet Take 1 tablet (10 mg total) by mouth daily. 11/25/17  Yes Glean Hess, MD  Icosapent Ethyl (VASCEPA) 1 g CAPS Take 2 capsules (2 g total) by mouth 2 (two) times daily. 01/06/18  Yes Wellington Hampshire, MD  rosuvastatin (CRESTOR) 20 MG tablet Take 1 tablet (20 mg total) by mouth daily. 12/01/17  Yes Wellington Hampshire, MD    Review of Systems    Upper abdominal and chest discomfort at rest only, as outlined above.  He denies any exertional symptoms, dyspnea, palpitations, PND, orthopnea, dizziness, syncope, edema, or early satiety.  All other systems reviewed and are otherwise negative except as noted above.  Physical Exam    VS:  BP 110/68 (BP Location: Left Arm, Patient Position: Sitting, Cuff Size: Normal)   Pulse 64   Ht 5\' 11"  (1.803 m)   Wt 196 lb 8 oz (89.1 kg)   BMI 27.41 kg/m  , BMI Body mass index is 27.41 kg/m. GEN: Well nourished, well developed, in no acute distress.  HEENT: normal.  Neck: Supple, no JVD, carotid bruits, or masses. Cardiac: RRR, no murmurs, rubs, or gallops. No clubbing, cyanosis, edema.  Radials/DP/PT 2+ and equal bilaterally.  Respiratory:  Respirations regular and unlabored, clear to auscultation bilaterally. GI: Soft, nontender, nondistended, BS + x  4. MS: no deformity or atrophy. Skin: warm and dry, no rash. Neuro:  Strength and sensation are intact. Psych: Normal affect.  Accessory Clinical Findings    ECG -regular sinus rhythm, 64, no acute ST or T changes.  Assessment & Plan    1.  Chest pain/CAD: Patient with prior history of CAD status post RCA stenting in 2014 with negative stress test in 2017, who presents with a 3 to 4-week history of intermittent, mild, upper abdominal and chest discomfort without associated symptoms, occurring at rest only, improving with exertion, and not limiting activities.  Prior anginal equivalent was exertional neck pain associated with dyspnea.  He has not had any of his prior anginal equivalent.  ECG is normal.  I will arrange for an exercise Myoview to rule out ischemia.  Continue aspirin and statin therapy.  Of note, he was recently placed on vascepa and shortly thereafter developed diarrhea and abdominal/chest symptoms.  He has stopped this.  Symptoms did not immediately improve after stopping it but have lessened significantly over the past week.  I have advised him to remain off of vascepa.  2.  Hyperlipidemia/hypertriglyceridemia: LDL was 71 in February.  Recently placed on Vascepa but discontinued secondary to diarrhea and questionable contribution to abdominal discomfort.  3.  PVCs: No recent symptoms.  4.  Disposition: Follow-up stress testing as outlined above.  Follow-up with Dr. Fletcher Anon in September or sooner if stress testing abnormal.   Murray Hodgkins, NP 03/04/2018, 9:19 AM

## 2018-03-09 ENCOUNTER — Ambulatory Visit
Admission: RE | Admit: 2018-03-09 | Discharge: 2018-03-09 | Disposition: A | Discharge status: Home / Self Care | Payer: 59 | Source: Ambulatory Visit | Attending: Nurse Practitioner | Admitting: Nurse Practitioner

## 2018-03-09 DIAGNOSIS — R079 Chest pain, unspecified: Secondary | ICD-10-CM | POA: Diagnosis present

## 2018-03-09 LAB — NM MYOCAR MULTI W/SPECT W/WALL MOTION / EF
CHL CUP NUCLEAR SDS: 4
CSEPED: 11 min
CSEPEW: 13.4 METS
CSEPHR: 97 %
Exercise duration (sec): 0 s
LV sys vol: 34 mL
LVDIAVOL: 90 mL (ref 62–150)
Peak HR: 157 {beats}/min
Rest HR: 57 {beats}/min
SRS: 0
SSS: 4
TID: 0.87

## 2018-03-09 MED ORDER — TECHNETIUM TC 99M TETROFOSMIN IV KIT
30.0000 | PACK | Freq: Once | INTRAVENOUS | Status: AC | PRN
  Administered 2018-03-09: 31.53 via INTRAVENOUS

## 2018-03-09 MED ORDER — TECHNETIUM TC 99M TETROFOSMIN IV KIT
13.0000 | PACK | Freq: Once | INTRAVENOUS | Status: AC | PRN
  Administered 2018-03-09: 13.78 via INTRAVENOUS

## 2018-05-29 ENCOUNTER — Other Ambulatory Visit: Payer: Self-pay | Admitting: Internal Medicine

## 2018-05-29 DIAGNOSIS — F324 Major depressive disorder, single episode, in partial remission: Secondary | ICD-10-CM

## 2018-05-30 ENCOUNTER — Other Ambulatory Visit: Payer: Self-pay | Admitting: Cardiovascular Disease

## 2018-07-07 ENCOUNTER — Ambulatory Visit: Payer: 59 | Admitting: Cardiovascular Disease

## 2018-08-18 ENCOUNTER — Telehealth: Payer: Self-pay

## 2018-08-18 ENCOUNTER — Other Ambulatory Visit: Payer: Self-pay

## 2018-08-18 DIAGNOSIS — Z1211 Encounter for screening for malignant neoplasm of colon: Secondary | ICD-10-CM

## 2018-08-18 NOTE — Telephone Encounter (Signed)
Colonoscopy Scheduled for 09/01/18 with Dr. Allen Norris at [facility].

## 2018-09-01 ENCOUNTER — Encounter
Admission: RE | Disposition: A | Discharge status: Home / Self Care | Payer: Self-pay | Source: Ambulatory Visit | Attending: Gastroenterology

## 2018-09-01 ENCOUNTER — Ambulatory Visit: Payer: No Typology Code available for payment source | Admitting: Certified Registered"

## 2018-09-01 ENCOUNTER — Ambulatory Visit
Admission: RE | Admit: 2018-09-01 | Discharge: 2018-09-01 | Disposition: A | Discharge status: Home / Self Care | Payer: No Typology Code available for payment source | Source: Ambulatory Visit | Attending: Gastroenterology | Admitting: Gastroenterology

## 2018-09-01 ENCOUNTER — Encounter: Payer: Self-pay | Admitting: *Deleted

## 2018-09-01 DIAGNOSIS — Z955 Presence of coronary angioplasty implant and graft: Secondary | ICD-10-CM | POA: Diagnosis not present

## 2018-09-01 DIAGNOSIS — Z79899 Other long term (current) drug therapy: Secondary | ICD-10-CM | POA: Insufficient documentation

## 2018-09-01 DIAGNOSIS — K640 First degree hemorrhoids: Secondary | ICD-10-CM | POA: Diagnosis not present

## 2018-09-01 DIAGNOSIS — Z1211 Encounter for screening for malignant neoplasm of colon: Secondary | ICD-10-CM | POA: Insufficient documentation

## 2018-09-01 DIAGNOSIS — Z7982 Long term (current) use of aspirin: Secondary | ICD-10-CM | POA: Insufficient documentation

## 2018-09-01 DIAGNOSIS — M1990 Unspecified osteoarthritis, unspecified site: Secondary | ICD-10-CM | POA: Insufficient documentation

## 2018-09-01 DIAGNOSIS — I2510 Atherosclerotic heart disease of native coronary artery without angina pectoris: Secondary | ICD-10-CM | POA: Insufficient documentation

## 2018-09-01 DIAGNOSIS — J449 Chronic obstructive pulmonary disease, unspecified: Secondary | ICD-10-CM | POA: Diagnosis not present

## 2018-09-01 DIAGNOSIS — D123 Benign neoplasm of transverse colon: Secondary | ICD-10-CM | POA: Diagnosis not present

## 2018-09-01 DIAGNOSIS — E785 Hyperlipidemia, unspecified: Secondary | ICD-10-CM | POA: Insufficient documentation

## 2018-09-01 DIAGNOSIS — K635 Polyp of colon: Secondary | ICD-10-CM | POA: Diagnosis not present

## 2018-09-01 DIAGNOSIS — F329 Major depressive disorder, single episode, unspecified: Secondary | ICD-10-CM | POA: Diagnosis not present

## 2018-09-01 DIAGNOSIS — Z791 Long term (current) use of non-steroidal anti-inflammatories (NSAID): Secondary | ICD-10-CM | POA: Diagnosis not present

## 2018-09-01 HISTORY — PX: COLONOSCOPY WITH PROPOFOL: SHX5780

## 2018-09-01 SURGERY — COLONOSCOPY WITH PROPOFOL
Anesthesia: General

## 2018-09-01 MED ORDER — LIDOCAINE HCL (CARDIAC) PF 100 MG/5ML IV SOSY
PREFILLED_SYRINGE | INTRAVENOUS | Status: DC | PRN
  Administered 2018-09-01: 60 mg via INTRAVENOUS

## 2018-09-01 MED ORDER — SODIUM CHLORIDE 0.9 % IV SOLN
INTRAVENOUS | Status: DC
  Administered 2018-09-01: 08:00:00 via INTRAVENOUS

## 2018-09-01 MED ORDER — PROPOFOL 10 MG/ML IV BOLUS
INTRAVENOUS | Status: AC
  Filled 2018-09-01: qty 20

## 2018-09-01 MED ORDER — PROPOFOL 10 MG/ML IV BOLUS
INTRAVENOUS | Status: DC | PRN
  Administered 2018-09-01: 70 mg via INTRAVENOUS
  Administered 2018-09-01: 30 mg via INTRAVENOUS

## 2018-09-01 MED ORDER — PROPOFOL 500 MG/50ML IV EMUL
INTRAVENOUS | Status: AC
  Filled 2018-09-01: qty 50

## 2018-09-01 MED ORDER — PROPOFOL 500 MG/50ML IV EMUL
INTRAVENOUS | Status: DC | PRN
  Administered 2018-09-01: 150 ug/kg/min via INTRAVENOUS

## 2018-09-01 NOTE — H&P (Signed)
[name], MD [facility] [address] [place], [place] [address] Phone: [phone] Fax : [phone]  Primary Care Physician:  Glean Hess, MD Primary Gastroenterologist:  Dr. Allen Norris  Pre-Procedure History & Physical: HPI:  HAVARD Garner is a 62 y.o. male is here for a screening colonoscopy.   Past Medical History:  Diagnosis Date  . Coronary artery disease    a. 05/2013 s/p PCI/DES to the mid/distal RCA;  b. 11/2015 MV: EF 51%, no ischemia/infarct.  . Hyperlipidemia   . PVC's (premature ventricular contractions)     Past Surgical History:  Procedure Laterality Date  . CARDIAC CATHETERIZATION  06/22/2013  . CORONARY ANGIOPLASTY WITH STENT PLACEMENT  06/22/2013   stent placement in the distal RCA & mid RCA  . INCISION AND DRAINAGE PERIRECTAL ABSCESS N/A 04/01/2016   Procedure: IRRIGATION AND DEBRIDEMENT PERIRECTAL ABSCESS;  Surgeon: Christene Lye, MD;  Location: [facility] ORS;  Service: General;  Laterality: N/A;    Prior to Admission medications   Medication Sig Start Date End Date Taking? Authorizing Provider  escitalopram (LEXAPRO) 10 MG tablet TAKE 1 TABLET BY MOUTH EVERY DAY 05/29/18  Yes Glean Hess, MD  naproxen sodium (ALEVE) 220 MG tablet Take 220 mg by mouth daily as needed.   Yes [provider]  rosuvastatin (CRESTOR) 20 MG tablet TAKE 1 TABLET BY MOUTH EVERY DAY 06/01/18  Yes Wellington Hampshire, MD  aspirin EC 81 MG tablet Take 1 tablet (81 mg total) by mouth daily. 12/07/15   Deboraha Sprang, MD  diclofenac (VOLTAREN) 75 MG EC tablet Take 1 tablet (75 mg total) by mouth 2 (two) times daily. 01/19/18   Glean Hess, MD  Icosapent Ethyl (VASCEPA) 1 g CAPS Take 2 capsules (2 g total) by mouth 2 (two) times daily. 01/06/18   Wellington Hampshire, MD    Allergies as of 08/18/2018  . (No Known Allergies)    Family History  Problem Relation Age of Onset  . Cancer Mother 18       gyn  . Heart disease Father        CABG x 2   . Heart  attack Father 49  . Hyperlipidemia Father     Social History   Socioeconomic History  . Marital status: Married    Spouse name: Not on file  . Number of children: Not on file  . Years of education: Not on file  . Highest education level: Not on file  Occupational History  . Not on file  Social Needs  . Financial resource strain: Not on file  . Food insecurity:    Worry: Not on file    Inability: Not on file  . Transportation needs:    Medical: Not on file    Non-medical: Not on file  Tobacco Use  . Smoking status: Never Smoker  . Smokeless tobacco: Never Used  Substance and Sexual Activity  . Alcohol use: Yes    Comment: 5-10 Beers or Glasses of Wine Weekly  . Drug use: No  . Sexual activity: Not on file  Lifestyle  . Physical activity:    Days per week: Not on file    Minutes per session: Not on file  . Stress: Not on file  Relationships  . Social connections:    Talks on phone: Not on file    Gets together: Not on file    Attends religious service: Not on file    Active member of club or  organization: Not on file    Attends meetings of clubs or organizations: Not on file    Relationship status: Not on file  . Intimate partner violence:    Fear of current or ex partner: Not on file    Emotionally abused: Not on file    Physically abused: Not on file    Forced sexual activity: Not on file  Other Topics Concern  . Not on file  Social History Narrative  . Not on file    Review of Systems: See HPI, otherwise negative ROS  Physical Exam: BP (!) 115/97   Pulse 70   Temp (!) 96.8 F (36 C) (Tympanic)   Resp 20   Ht 6\' 1"  (1.854 m)   Wt 88.5 kg   SpO2 100%   BMI 25.73 kg/m  General:   Alert,  pleasant and cooperative in NAD Head:  Normocephalic and atraumatic. Neck:  Supple; no masses or thyromegaly. Lungs:  Clear throughout to auscultation.    Heart:  Regular rate and rhythm. Abdomen:  Soft, nontender and nondistended. Normal bowel sounds, without  guarding, and without rebound.   Neurologic:  Alert and  oriented x4;  grossly normal neurologically.  Impression/Plan: Paul Garner is now here to undergo a screening colonoscopy.  Risks, benefits, and alternatives regarding colonoscopy have been reviewed with the patient.  Questions have been answered.  All parties agreeable.

## 2018-09-01 NOTE — Transfer of Care (Signed)
Immediate Anesthesia Transfer of Care Note  Patient: Paul Garner  Procedure(s) Performed: COLONOSCOPY WITH PROPOFOL (N/A )  Patient Location: Endoscopy Unit  Anesthesia Type:General  Level of Consciousness: awake, alert  and oriented  Airway & Oxygen Therapy: Patient Spontanous Breathing  Post-op Assessment: Report given to RN and Post -op Vital signs reviewed and stable  Post vital signs: Reviewed and stable  Last Vitals:  Vitals Value Taken Time  BP    Temp    Pulse    Resp    SpO2      Last Pain:  Vitals:   09/01/18 0811  TempSrc: Tympanic         Complications: No apparent anesthesia complications

## 2018-09-01 NOTE — Anesthesia Postprocedure Evaluation (Signed)
Anesthesia Post Note  Patient: Paul Garner  Procedure(s) Performed: COLONOSCOPY WITH PROPOFOL (N/A )  Patient location during evaluation: PACU Anesthesia Type: General Level of consciousness: awake and alert Pain management: pain level controlled Vital Signs Assessment: post-procedure vital signs reviewed and stable Respiratory status: spontaneous breathing, nonlabored ventilation and respiratory function stable Cardiovascular status: blood pressure returned to baseline and stable Postop Assessment: no apparent nausea or vomiting Anesthetic complications: no     Last Vitals:  Vitals:   09/01/18 0958 09/01/18 1008  BP: 128/80 133/90  Pulse: 71 (!) 58  Resp: (!) 7 13  Temp:    SpO2: 100% 100%    Last Pain:  Vitals:   09/01/18 1008  TempSrc:   PainSc: 0-No pain                 Durenda Hurt

## 2018-09-01 NOTE — Anesthesia Preprocedure Evaluation (Addendum)
Anesthesia Evaluation  Patient identified by MRN, date of birth, ID band Patient awake    Reviewed: Allergy & Precautions, H&P , NPO status , Patient's Chart, lab work & pertinent test results  Airway Mallampati: II  TM Distance: >3 FB Neck ROM: full    Dental  (+) Teeth Intact   Pulmonary neg pulmonary ROS, neg COPD,    breath sounds clear to auscultation       Cardiovascular Exercise Tolerance: Good (-) angina+ CAD and + Cardiac Stents  (-) DOE + dysrhythmias (PVC's)  Rhythm:regular Rate:Normal     Neuro/Psych PSYCHIATRIC DISORDERS Depression negative neurological ROS  negative psych ROS   GI/Hepatic negative GI ROS, Neg liver ROS,   Endo/Other  negative endocrine ROS  Renal/GU negative Renal ROS  negative genitourinary   Musculoskeletal  (+) Arthritis ,   Abdominal   Peds  Hematology negative hematology ROS (+)   Anesthesia Other Findings Past Medical History: No date: Coronary artery disease     Comment:  a. 05/2013 s/p PCI/DES to the mid/distal RCA;  b. 11/2015               MV: EF 51%, no ischemia/infarct. No date: Hyperlipidemia No date: PVC's (premature ventricular contractions)  Past Surgical History: 06/22/2013: CARDIAC CATHETERIZATION 06/22/2013: CORONARY ANGIOPLASTY WITH STENT PLACEMENT     Comment:  stent placement in the distal RCA & mid RCA 04/01/2016: INCISION AND DRAINAGE PERIRECTAL ABSCESS; N/A     Comment:  Procedure: IRRIGATION AND DEBRIDEMENT PERIRECTAL               ABSCESS;  Surgeon: Christene Lye, MD;  Location:               [facility] ORS;  Service: General;  Laterality: N/A;  BMI    Body Mass Index:  25.73 kg/m      Reproductive/Obstetrics negative OB ROS                            Anesthesia Physical Anesthesia Plan  ASA: III  Anesthesia Plan: General   Post-op Pain Management:    Induction:   PONV Risk Score and Plan: Propofol infusion and  TIVA  Airway Management Planned: Natural Airway and Nasal Cannula  Additional Equipment:   Intra-op Plan:   Post-operative Plan:   Informed Consent: I have reviewed the patients History and Physical, chart, labs and discussed the procedure including the risks, benefits and alternatives for the proposed anesthesia with the patient or authorized representative who has indicated his/her understanding and acceptance.   Dental Advisory Given  Plan Discussed with: Anesthesiologist, CRNA and Surgeon  Anesthesia Plan Comments:         Anesthesia Quick Evaluation

## 2018-09-01 NOTE — Anesthesia Post-op Follow-up Note (Signed)
Anesthesia QCDR form completed.        

## 2018-09-01 NOTE — Op Note (Signed)
[facility] Gastroenterology Patient Name: Paul Garner Procedure Date: 09/01/2018 9:12 AM MRN: [id number] Account #: [id number] Date of Birth: [birth year] Admit Type: Outpatient Age: 62 Room: [facility] ENDO ROOM 4 Gender: Male Note Status: Finalized Procedure:            Colonoscopy Indications:          Screening for colorectal malignant neoplasm Providers:            [name] MD, MD Referring MD:         Halina Maidens, MD (Referring MD) Medicines:            Propofol per Anesthesia Complications:        No immediate complications. Procedure:            Pre-Anesthesia Assessment:                       - Prior to the procedure, a History and Physical was                        performed, and patient medications and allergies were                        reviewed. The patient's tolerance of previous                        anesthesia was also reviewed. The risks and benefits of                        the procedure and the sedation options and risks were                        discussed with the patient. All questions were                        answered, and informed consent was obtained. Prior                        Anticoagulants: The patient has taken no previous                        anticoagulant or antiplatelet agents. ASA Grade                        Assessment: II - A patient with mild systemic disease.                        After reviewing the risks and benefits, the patient was                        deemed in satisfactory condition to undergo the                        procedure.                       After obtaining informed consent, the colonoscope was                        passed under direct vision. Throughout the procedure,  the patient's blood pressure, pulse, and oxygen                        saturations were monitored continuously. The                        Colonoscope was introduced through the anus and          advanced to the the cecum, identified by appendiceal                        orifice and ileocecal valve. The colonoscopy was                        performed without difficulty. The patient tolerated the                        procedure well. The quality of the bowel preparation                        was excellent. Findings:      The perianal and digital rectal examinations were normal.      A 4 mm polyp was found in the transverse colon. The polyp was sessile.       The polyp was removed with a cold snare. Resection and retrieval were       complete.      Non-bleeding internal hemorrhoids were found during retroflexion. The       hemorrhoids were Grade I (internal hemorrhoids that do not prolapse). Impression:           - One 4 mm polyp in the transverse colon, removed with                        a cold snare. Resected and retrieved.                       - Non-bleeding internal hemorrhoids. Recommendation:       - Discharge patient to home.                       - Resume previous diet.                       - Continue present medications.                       - Await pathology results.                       - Repeat colonoscopy in 5 years if polyp adenoma and 10                        years if hyperplastic Procedure Code(s):    --- Professional ---                       [phone], Colonoscopy, flexible; with removal of tumor(s),                        polyp(s), or other lesion(s) by snare technique Diagnosis Code(s):    --- Professional ---  Z12.11, Encounter for screening for malignant neoplasm                        of colon                       D12.3, Benign neoplasm of transverse colon (hepatic                        flexure or splenic flexure) CPT copyright 2018 American Medical Association. All rights reserved. The codes documented in this report are preliminary and upon coder review may  be revised to meet current compliance requirements. [name] MD, MD 09/01/2018 9:33:54 AM This report has been signed electronically. Number of Addenda: 0 Note Initiated On: 09/01/2018 9:12 AM Scope Withdrawal Time: 0 hours 8 minutes 46 seconds  Total Procedure Duration: 0 hours 11 minutes 0 seconds       [facility]

## 2018-09-02 LAB — SURGICAL PATHOLOGY

## 2018-09-03 ENCOUNTER — Encounter: Payer: Self-pay | Admitting: Gastroenterology

## 2018-09-03 ENCOUNTER — Encounter: Payer: Self-pay | Admitting: Internal Medicine

## 2018-09-15 ENCOUNTER — Telehealth: Payer: Self-pay | Admitting: Gastroenterology

## 2018-09-15 NOTE — Telephone Encounter (Signed)
Patient states he saw Dr.Wohl a couple weeks ago for a procedure and is now having diarrhea. Patient wanting advice. Patient scheduled for first open appt on 12.26.19.

## 2018-09-15 NOTE — Telephone Encounter (Signed)
When did the diarrhea start.  If it started recently he should have his stool sent off for pathogens.

## 2018-09-16 ENCOUNTER — Other Ambulatory Visit: Payer: Self-pay

## 2018-09-16 DIAGNOSIS — R197 Diarrhea, unspecified: Secondary | ICD-10-CM

## 2018-09-16 NOTE — Telephone Encounter (Signed)
Pt stated diarrhea has been going on since before the colonoscopy. I went ahead and ordered stool test.

## 2018-09-18 ENCOUNTER — Encounter: Payer: Self-pay | Admitting: Cardiovascular Disease

## 2018-09-18 ENCOUNTER — Other Ambulatory Visit
Admission: RE | Admit: 2018-09-18 | Discharge: 2018-09-18 | Disposition: A | Discharge status: Home / Self Care | Payer: 59 | Source: Ambulatory Visit | Attending: Gastroenterology | Admitting: Gastroenterology

## 2018-09-18 ENCOUNTER — Ambulatory Visit (INDEPENDENT_AMBULATORY_CARE_PROVIDER_SITE_OTHER): Payer: 59 | Admitting: Cardiovascular Disease

## 2018-09-18 VITALS — BP 124/70 | HR 65 | Ht 72.0 in | Wt 201.8 lb

## 2018-09-18 DIAGNOSIS — R197 Diarrhea, unspecified: Secondary | ICD-10-CM | POA: Insufficient documentation

## 2018-09-18 DIAGNOSIS — E785 Hyperlipidemia, unspecified: Secondary | ICD-10-CM | POA: Diagnosis not present

## 2018-09-18 DIAGNOSIS — I2510 Atherosclerotic heart disease of native coronary artery without angina pectoris: Secondary | ICD-10-CM

## 2018-09-18 DIAGNOSIS — I493 Ventricular premature depolarization: Secondary | ICD-10-CM

## 2018-09-18 LAB — GASTROINTESTINAL PANEL BY PCR, STOOL (REPLACES STOOL CULTURE)

## 2018-09-18 LAB — C DIFFICILE QUICK SCREEN W PCR REFLEX
C DIFFICILE (CDIFF) TOXIN: NEGATIVE
C Diff antigen: NEGATIVE
C Diff interpretation: NOT DETECTED

## 2018-09-18 NOTE — Progress Notes (Signed)
Cardiology Office Note   Date:  09/18/2018   ID:  Paul Garner, DOB [birth year], MRN [id number]  PCP:  Paul Hess, MD  Cardiologist:   Paul Sacramento, MD   Chief Complaint  Patient presents with  . other    Follow up Myoview. "doing well." Meds reviewed by the pt. verbally.       History of Present Illness: Paul Garner is a 62 y.o. male who is here today for a follow-up visit regarding coronary artery disease and PVCs. He has known history of coronary artery disease with previous stenting of the right coronary artery in 2014 for stable angina. No previous myocardial infarction. Other medical problems include PVCs, hyperlipidemia and gout. He is a lifelong nonsmoker. He has extensive family history of coronary artery disease.   He was seen in May for atypical chest pain.  He underwent a treadmill nuclear stress test which showed no evidence of ischemia with normal ejection fraction.  He was able to exercise for 11 minutes.  He has mixed hyperlipidemia and was placed on Vascepa.  However, he did not tolerate the medication due to worsening diarrhea.  He has been doing well otherwise and denies any chest pain, shortness of breath or palpitations.  Past Medical History:  Diagnosis Date  . Coronary artery disease    a. 05/2013 s/p PCI/DES to the mid/distal RCA;  b. 11/2015 MV: EF 51%, no ischemia/infarct.  . Hyperlipidemia   . PVC's (premature ventricular contractions)     Past Surgical History:  Procedure Laterality Date  . CARDIAC CATHETERIZATION  06/22/2013  . COLONOSCOPY WITH PROPOFOL N/A 09/01/2018   Procedure: COLONOSCOPY WITH PROPOFOL;  Surgeon: Paul Lame, MD;  Location: [facility] ENDOSCOPY;  Service: Endoscopy;  Laterality: N/A;  . CORONARY ANGIOPLASTY WITH STENT PLACEMENT  06/22/2013   stent placement in the distal RCA & mid RCA  . INCISION AND DRAINAGE PERIRECTAL ABSCESS N/A 04/01/2016   Procedure: IRRIGATION AND DEBRIDEMENT PERIRECTAL ABSCESS;  Surgeon:  Paul Lye, MD;  Location: [facility] ORS;  Service: General;  Laterality: N/A;     Current Outpatient Medications  Medication Sig Dispense Refill  . aspirin EC 81 MG tablet Take 1 tablet (81 mg total) by mouth daily.    . diclofenac (VOLTAREN) 75 MG EC tablet Take 1 tablet (75 mg total) by mouth 2 (two) times daily. 60 tablet 1  . escitalopram (LEXAPRO) 10 MG tablet TAKE 1 TABLET BY MOUTH EVERY DAY 90 tablet 1  . Icosapent Ethyl (VASCEPA) 1 g CAPS Take 2 capsules (2 g total) by mouth 2 (two) times daily. 120 capsule 5  . naproxen sodium (ALEVE) 220 MG tablet Take 220 mg by mouth daily as needed.    . rosuvastatin (CRESTOR) 20 MG tablet TAKE 1 TABLET BY MOUTH EVERY DAY 90 tablet 2   No current facility-administered medications for this visit.     Allergies:   Patient has no known allergies.    Social History:  The patient  reports that he has never smoked. He has never used smokeless tobacco. He reports that he drinks alcohol. He reports that he does not use drugs.   Family History:  The patient's family history includes Cancer (age of onset: 4) in his mother; Heart attack (age of onset: 71) in his father; Heart disease in his father; Hyperlipidemia in his father.    ROS:  Please see the history of present illness.   Otherwise, review of systems are positive for none.  All other systems are reviewed and negative.    PHYSICAL EXAM: VS:  BP 124/70 (BP Location: Left Arm, Patient Position: Sitting, Cuff Size: Normal)   Pulse 65   Ht 6' (1.829 m)   Wt 201 lb 12 oz (91.5 kg)   BMI 27.36 kg/m  , BMI Body mass index is 27.36 kg/m. GEN: Well nourished, well developed, in no acute distress  HEENT: normal  Neck: no JVD, carotid bruits, or masses Cardiac: RRR; no murmurs, rubs, or gallops,no edema  Respiratory:  clear to auscultation bilaterally, normal work of breathing GI: soft, nontender, nondistended, + BS MS: no deformity or atrophy  Skin: warm and dry, no rash Neuro:   Strength and sensation are intact Psych: euthymic mood, full affect   EKG:  EKG is ordered today. EKG shows normal sinus rhythm with no significant ST or T wave changes.   Recent Labs: 12/02/2017: ALT 34; BUN 18; Creatinine, Ser 1.04; Hemoglobin 15.4; Platelets 269; Potassium 4.5; Sodium 140; TSH 2.030    Lipid Panel    Component Value Date/Time   CHOL 157 12/02/2017 1011   TRIG 187 (H) 12/02/2017 1011   HDL 49 12/02/2017 1011   CHOLHDL 3.2 12/02/2017 1011   CHOLHDL 3.3 12/12/2015 1007   VLDL 29 12/12/2015 1007   LDLCALC 71 12/02/2017 1011      Wt Readings from Last 3 Encounters:  09/18/18 201 lb 12 oz (91.5 kg)  09/01/18 195 lb (88.5 kg)  03/04/18 196 lb 8 oz (89.1 kg)        ASSESSMENT AND PLAN:  1.  Coronary artery disease involving native coronary arteries without angina: He is overall doing well with no anginal symptoms. Continue low-dose aspirin.   2.  Mixed hyperlipidemia: I reviewed mostly recent lipid profile done in February which showed an LDL of 71 and triglyceride of 187.  He did not tolerate Vascepa due to GI side effects.  Continue treatment with rosuvastatin.  3. PVCs: The burden was not significant enough to require treatment.  His palpitations are very mild.   Disposition:   FU with me in 12 months  Signed,  Paul Sacramento, MD  09/18/2018 4:05 PM    [facility]

## 2018-09-18 NOTE — Patient Instructions (Signed)
Medication Instructions:  No changes  If you need a refill on your cardiac medications before your next appointment, please call your pharmacy.   Lab work: None ordered  Testing/Procedures: None ordered  Follow-Up: At [facility], you and your health needs are our priority.  As part of our continuing mission to provide you with exceptional heart care, we have created designated Provider Care Teams.  These Care Teams include your primary Cardiologist (physician) and Advanced Practice Providers (APPs -  Physician Assistants and Nurse Practitioners) who all work together to provide you with the care you need, when you need it. You will need a follow up appointment in 12 months.  Please call our office 2 months in advance to schedule this appointment.  You may see Muhammad Arida, MD or one of the following Advanced Practice Providers on your designated Care Team:   Christopher Berge, NP Ryan Dunn, PA-C . Jacquelyn Visser, PA-C    

## 2018-10-22 ENCOUNTER — Ambulatory Visit (INDEPENDENT_AMBULATORY_CARE_PROVIDER_SITE_OTHER): Payer: 59 | Admitting: Gastroenterology

## 2018-10-22 ENCOUNTER — Encounter

## 2018-10-22 ENCOUNTER — Encounter: Payer: Self-pay | Admitting: Gastroenterology

## 2018-10-22 VITALS — BP 127/83 | HR 83 | Ht 72.0 in | Wt 203.6 lb

## 2018-10-22 DIAGNOSIS — K580 Irritable bowel syndrome with diarrhea: Secondary | ICD-10-CM

## 2018-10-22 MED ORDER — DIPHENOXYLATE-ATROPINE 2.5-0.025 MG PO TABS: 1.0000 | ORAL_TABLET | Freq: Four times a day (QID) | ORAL | 3 refills | Status: AC | PRN

## 2018-10-22 NOTE — Progress Notes (Signed)
Primary Care Physician: Glean Hess, MD  Primary Gastroenterologist:  Dr. [name]  Chief Complaint  Patient presents with  . Diarrhea    HPI: Paul Garner is a 63 y.o. male here for diarrhea.  The patient had come to see me for a colonoscopy and at that time denied any symptoms.  That was on November 5 of this year.  The patient was found to have 1 polyp that was removed and was shown to be a hyperplastic polyp.  The patient called a short time after being told the results and wanted to know why he was having diarrhea.  On further investigation the patient reported that he had been having diarrhea for some time prior to the colonoscopy despite not mentioning it prior to the colonoscopy and in fact denying any GI problems.  At the time of the colonoscopy the patient did not have any random biopsies taken to rule out microscopic colitis because the patient had not reported any symptoms.  The patient reports that his symptoms are worse with stress and have been going on since he is approximate 62 years old.  He also reported to be worse with stress and denies it waking him up in the middle the night.  Current Outpatient Medications  Medication Sig Dispense Refill  . aspirin EC 81 MG tablet Take 1 tablet (81 mg total) by mouth daily.    Marland Kitchen escitalopram (LEXAPRO) 10 MG tablet TAKE 1 TABLET BY MOUTH EVERY DAY 90 tablet 1  . naproxen sodium (ALEVE) 220 MG tablet Take 220 mg by mouth daily as needed.    . rosuvastatin (CRESTOR) 20 MG tablet TAKE 1 TABLET BY MOUTH EVERY DAY 90 tablet 2  . diclofenac (VOLTAREN) 75 MG EC tablet Take 1 tablet (75 mg total) by mouth 2 (two) times daily. (Patient not taking: Reported on 10/22/2018) 60 tablet 1   No current facility-administered medications for this visit.     Allergies as of 10/22/2018 - Review Complete 10/22/2018  Allergen Reaction Noted  . Vascepa [epa ethyl ester]  09/18/2018    ROS:  General: Negative for anorexia, weight  loss, fever, chills, fatigue, weakness. ENT: Negative for hoarseness, difficulty swallowing , nasal congestion. CV: Negative for chest pain, angina, palpitations, dyspnea on exertion, peripheral edema.  Respiratory: Negative for dyspnea at rest, dyspnea on exertion, cough, sputum, wheezing.  GI: See history of present illness. GU:  Negative for dysuria, hematuria, urinary incontinence, urinary frequency, nocturnal urination.  Endo: Negative for unusual weight change.    Physical Examination:   BP 127/83   Pulse 83   Ht 6' (1.829 m)   Wt 203 lb 9.6 oz (92.4 kg)   BMI 27.61 kg/m   General: Well-nourished, well-developed in no acute distress.  Eyes: No icterus. Conjunctivae pink. Mouth: Oropharyngeal mucosa moist and pink , no lesions erythema or exudate. Lungs: Clear to auscultation bilaterally. Non-labored. Heart: Regular rate and rhythm, no murmurs rubs or gallops.  Abdomen: Bowel sounds are normal, nontender, nondistended, no hepatosplenomegaly or masses, no abdominal bruits or hernia , no rebound or guarding.   Extremities: No lower extremity edema. No clubbing or deformities. Neuro: Alert and oriented x 3.  Grossly intact. Skin: Warm and dry, no jaundice.   Psych: Alert and cooperative, normal mood and affect.  Labs:    Imaging Studies: No results found.  Assessment and Plan:   Paul Garner is a 62 y.o. y/o male who has had attacks of diarrhea without any  worry symptoms.  The patient has had this since he was 62 years old and is likely related to irritable bowel syndrome versus dietary intake.  The patient has been told to try and keep a food diary or at least see what may be associated with the bouts of diarrhea.  He has also been given Lomotil for when he has his attacks of diarrhea.  He has also been encouraged to start a fiber supplement.  The patient has been explained the plan and agrees with it.    [name], MD. Marval Regal   Note: This dictation was prepared  with Dragon dictation along with smaller phrase technology. Any transcriptional errors that result from this process are unintentional.

## 2018-11-20 ENCOUNTER — Other Ambulatory Visit: Payer: Self-pay | Admitting: Internal Medicine

## 2018-11-20 DIAGNOSIS — F324 Major depressive disorder, single episode, in partial remission: Secondary | ICD-10-CM

## 2018-12-07 ENCOUNTER — Ambulatory Visit (INDEPENDENT_AMBULATORY_CARE_PROVIDER_SITE_OTHER): Payer: 59 | Admitting: Internal Medicine

## 2018-12-07 ENCOUNTER — Other Ambulatory Visit: Payer: Self-pay

## 2018-12-07 ENCOUNTER — Encounter: Payer: Self-pay | Admitting: Internal Medicine

## 2018-12-07 VITALS — BP 118/78 | HR 61 | Ht 72.0 in | Wt 193.0 lb

## 2018-12-07 DIAGNOSIS — Z0000 Encounter for general adult medical examination without abnormal findings: Secondary | ICD-10-CM | POA: Diagnosis not present

## 2018-12-07 DIAGNOSIS — Z125 Encounter for screening for malignant neoplasm of prostate: Secondary | ICD-10-CM | POA: Diagnosis not present

## 2018-12-07 DIAGNOSIS — Z23 Encounter for immunization: Secondary | ICD-10-CM

## 2018-12-07 DIAGNOSIS — M67431 Ganglion, right wrist: Secondary | ICD-10-CM | POA: Insufficient documentation

## 2018-12-07 DIAGNOSIS — F324 Major depressive disorder, single episode, in partial remission: Secondary | ICD-10-CM

## 2018-12-07 DIAGNOSIS — K580 Irritable bowel syndrome with diarrhea: Secondary | ICD-10-CM | POA: Diagnosis not present

## 2018-12-07 DIAGNOSIS — E782 Mixed hyperlipidemia: Secondary | ICD-10-CM | POA: Insufficient documentation

## 2018-12-07 DIAGNOSIS — I25119 Atherosclerotic heart disease of native coronary artery with unspecified angina pectoris: Secondary | ICD-10-CM

## 2018-12-07 DIAGNOSIS — K589 Irritable bowel syndrome without diarrhea: Secondary | ICD-10-CM | POA: Insufficient documentation

## 2018-12-07 LAB — POCT URINALYSIS DIPSTICK
BILIRUBIN UA: NEGATIVE
Blood, UA: NEGATIVE
GLUCOSE UA: NEGATIVE
KETONES UA: NEGATIVE
Leukocytes, UA: NEGATIVE
Nitrite, UA: NEGATIVE
Protein, UA: NEGATIVE
Spec Grav, UA: 1.015 (ref 1.010–1.025)
Urobilinogen, UA: 0.2 E.U./dL
pH, UA: 5 (ref 5.0–8.0)

## 2018-12-07 NOTE — Progress Notes (Signed)
Date:  12/07/2018   Name:  Paul Garner   DOB:  [birth year]   MRN:  [id number]   Chief Complaint: Annual Exam Paul Garner is a 63 y.o. male who presents today for his Complete Annual Exam. He feels well. He reports exercising regularly. He reports he is sleeping fairly well. He is up to date on colonoscopy and vaccinations. He is interested in the Shingrix vaccine.  Hyperlipidemia  This is a chronic problem. The problem is controlled. Pertinent negatives include no chest pain, myalgias or shortness of breath. Current antihyperlipidemic treatment includes statins. The current treatment provides significant improvement of lipids.  Depression         This is a chronic problem.  The problem has been resolved since onset.  Associated symptoms include no fatigue, no appetite change, no myalgias and no headaches.  Past treatments include SSRIs - Selective serotonin reuptake inhibitors. CAD - stable s/p PTCA and stent.  Seen by cardiology in May 2019, had stress testing which was negative for ischemia.  He continues on aspirin and statin therapy. IBS with diarrhea - seen by GI, started on fiber and uses lomotil as needed.  Review of Systems  Constitutional: Negative for appetite change, chills, diaphoresis, fatigue and unexpected weight change.  HENT: Negative for hearing loss, tinnitus, trouble swallowing and voice change.   Eyes: Negative for visual disturbance.  Respiratory: Negative for choking, shortness of breath and wheezing.   Cardiovascular: Negative for chest pain, palpitations and leg swelling.  Gastrointestinal: Negative for abdominal pain, blood in stool, constipation and diarrhea.  Genitourinary: Negative for difficulty urinating, dysuria, frequency and urgency.       Nocturia x 2  Musculoskeletal: Positive for arthralgias (shoulder pain). Negative for back pain and myalgias.  Skin: Negative for color change and rash.  Allergic/Immunologic: Negative for environmental  allergies.  Neurological: Negative for dizziness, syncope and headaches.  Hematological: Negative for adenopathy.  Psychiatric/Behavioral: Positive for depression. Negative for dysphoric mood and sleep disturbance.    Patient Active Problem List   Diagnosis Date Noted  . Mixed hyperlipidemia 12/07/2018  . Hyperplastic polyp of large intestine   . Major depressive disorder with single episode, in partial remission ([place]) 11/29/2016  . Basal cell carcinoma, arm 07/24/2016  . Gout 07/24/2016  . Arthritis of both hands 07/24/2016  . PVC's (premature ventricular contractions)   . Coronary artery disease 06/22/2013    Allergies  Allergen Reactions  . Vascepa [Icosapent Ethyl]     Worsening diarrhea    Past Surgical History:  Procedure Laterality Date  . CARDIAC CATHETERIZATION  06/22/2013  . COLONOSCOPY WITH PROPOFOL N/A 09/01/2018   Procedure: COLONOSCOPY WITH PROPOFOL;  Surgeon: [name], MD;  Location: [facility] ENDOSCOPY;  Service: Endoscopy;  Laterality: N/A;  . CORONARY ANGIOPLASTY WITH STENT PLACEMENT  06/22/2013   stent placement in the distal RCA & mid RCA  . INCISION AND DRAINAGE PERIRECTAL ABSCESS N/A 04/01/2016   Procedure: IRRIGATION AND DEBRIDEMENT PERIRECTAL ABSCESS;  Surgeon: Christene Lye, MD;  Location: [facility] ORS;  Service: General;  Laterality: N/A;    Social History   Tobacco Use  . Smoking status: Never Smoker  . Smokeless tobacco: Never Used  Substance Use Topics  . Alcohol use: Yes    Comment: 5-10 Beers or Glasses of Wine Weekly  . Drug use: No     Medication list has been reviewed and updated.  Current Meds  Medication Sig  . aspirin EC 81 MG tablet Take 1 tablet (  81 mg total) by mouth daily.  Marland Kitchen escitalopram (LEXAPRO) 10 MG tablet TAKE 1 TABLET BY MOUTH EVERY DAY  . naproxen sodium (ALEVE) 220 MG tablet Take 220 mg by mouth daily as needed.  . rosuvastatin (CRESTOR) 20 MG tablet TAKE 1 TABLET BY MOUTH EVERY DAY    PHQ 2/9 Scores 12/07/2018  12/02/2017 11/21/2017 11/29/2016  PHQ - 2 Score 0 0 0 0  PHQ- 9 Score - 0 0 -    Physical Exam Vitals signs and nursing note reviewed.  Constitutional:      Appearance: Normal appearance. He is well-developed.  HENT:     Head: Normocephalic.     Right Ear: Tympanic membrane, ear canal and external ear normal.     Left Ear: Tympanic membrane, ear canal and external ear normal.     Nose: Nose normal.     Mouth/Throat:     Pharynx: Uvula midline.  Eyes:     Conjunctiva/sclera: Conjunctivae normal.     Pupils: Pupils are equal, round, and reactive to light.  Neck:     Musculoskeletal: Normal range of motion and neck supple.     Thyroid: No thyromegaly.     Vascular: No carotid bruit.  Cardiovascular:     Rate and Rhythm: Normal rate and regular rhythm.     Heart sounds: Normal heart sounds.  Pulmonary:     Effort: Pulmonary effort is normal.     Breath sounds: Normal breath sounds. No wheezing.  Chest:     Breasts:        Right: No mass.        Left: No mass.    Abdominal:     General: Bowel sounds are normal.     Palpations: Abdomen is soft.     Tenderness: There is no abdominal tenderness.  Musculoskeletal: Normal range of motion.       Arms:  Lymphadenopathy:     Cervical: No cervical adenopathy.  Skin:    General: Skin is warm and dry.  Neurological:     Mental Status: He is alert and oriented to person, place, and time.     Deep Tendon Reflexes: Reflexes are normal and symmetric.  Psychiatric:        Speech: Speech normal.        Behavior: Behavior normal.        Thought Content: Thought content normal.        Judgment: Judgment normal.     BP 118/78   Pulse 61   Ht 6' (1.829 m)   Wt 193 lb (87.5 kg)   SpO2 99%   BMI 26.18 kg/m   Assessment and Plan: 1. Annual physical exam Normal exam - POCT urinalysis dipstick  2. Prostate cancer screening DRE deferred - PSA  3. Coronary artery disease involving native coronary artery of native heart with angina  pectoris ([place]) Stable without angina Continue aspirin - CBC with Differential/Platelet  4. Mixed hyperlipidemia On statin therapy - Comprehensive metabolic panel - Lipid panel  5. Major depressive disorder with single episode, in partial remission ([place]) Doing well on current therapy - TSH  6. Irritable bowel syndrome with diarrhea controlled  7. Ganglion cyst of dorsum of right wrist Would refer if it becomes symptomatic  8. Need for shingles vaccine First dose today - Varicella-zoster vaccine IM   Partially dictated using Editor, commissioning. Any errors are unintentional.  Halina Maidens, MD [facility]  12/07/2018

## 2018-12-07 NOTE — Patient Instructions (Signed)

## 2018-12-08 ENCOUNTER — Other Ambulatory Visit: Payer: Self-pay | Admitting: Internal Medicine

## 2018-12-08 DIAGNOSIS — R7989 Other specified abnormal findings of blood chemistry: Secondary | ICD-10-CM

## 2018-12-08 DIAGNOSIS — R945 Abnormal results of liver function studies: Principal | ICD-10-CM

## 2018-12-08 LAB — LIPID PANEL
CHOL/HDL RATIO: 4 ratio (ref 0.0–5.0)
Cholesterol, Total: 172 mg/dL (ref 100–199)
HDL: 43 mg/dL (ref >39)
LDL CALC: 50 mg/dL (ref 0–99)
TRIGLYCERIDES: 393 mg/dL — AB (ref 0–149)
VLDL Cholesterol Cal: 79 mg/dL — ABNORMAL HIGH (ref 5–40)

## 2018-12-08 LAB — COMPREHENSIVE METABOLIC PANEL
A/G RATIO: 2 (ref 1.2–2.2)
ALT: 48 IU/L — AB (ref 0–44)
AST: 47 IU/L — AB (ref 0–40)
Albumin: 4.5 g/dL (ref 3.8–4.9)
Alkaline Phosphatase: 71 IU/L (ref 39–117)
BUN/Creatinine Ratio: 16 (ref 10–24)
BUN: 16 mg/dL (ref 8–27)
Bilirubin Total: 0.2 mg/dL (ref 0.0–1.2)
CALCIUM: 9.4 mg/dL (ref 8.6–10.2)
CO2: 23 mmol/L (ref 20–29)
Chloride: 102 mmol/L (ref 96–106)
Creatinine, Ser: 1.01 mg/dL (ref 0.76–1.27)
GFR, EST AFRICAN AMERICAN: 93 mL/min/{1.73_m2} (ref >59)
GFR, EST NON AFRICAN AMERICAN: 80 mL/min/{1.73_m2} (ref >59)
GLOBULIN, TOTAL: 2.3 g/dL (ref 1.5–4.5)
Glucose: 104 mg/dL — ABNORMAL HIGH (ref 65–99)
POTASSIUM: 4.3 mmol/L (ref 3.5–5.2)
Sodium: 139 mmol/L (ref 134–144)
TOTAL PROTEIN: 6.8 g/dL (ref 6.0–8.5)

## 2018-12-08 LAB — CBC WITH DIFFERENTIAL/PLATELET
BASOS: 1 %
Basophils Absolute: 0.1 10*3/uL (ref 0.0–0.2)
EOS (ABSOLUTE): 0.3 10*3/uL (ref 0.0–0.4)
EOS: 5 %
HEMATOCRIT: 44.6 % (ref 37.5–51.0)
Hemoglobin: 14.9 g/dL (ref 13.0–17.7)
IMMATURE GRANS (ABS): 0.1 10*3/uL (ref 0.0–0.1)
IMMATURE GRANULOCYTES: 1 %
LYMPHS: 29 %
Lymphocytes Absolute: 1.8 10*3/uL (ref 0.7–3.1)
MCH: 31.3 pg (ref 26.6–33.0)
MCHC: 33.4 g/dL (ref 31.5–35.7)
MCV: 94 fL (ref 79–97)
MONOCYTES: 7 %
Monocytes Absolute: 0.5 10*3/uL (ref 0.1–0.9)
NEUTROS PCT: 57 %
Neutrophils Absolute: 3.5 10*3/uL (ref 1.4–7.0)
PLATELETS: 227 10*3/uL (ref 150–450)
RBC: 4.76 x10E6/uL (ref 4.14–5.80)
RDW: 13.3 % (ref 11.6–15.4)
WBC: 6.2 10*3/uL (ref 3.4–10.8)

## 2018-12-08 LAB — PSA: PROSTATE SPECIFIC AG, SERUM: 0.6 ng/mL (ref 0.0–4.0)

## 2018-12-08 LAB — TSH: TSH: 2.18 u[IU]/mL (ref 0.450–4.500)

## 2019-02-26 ENCOUNTER — Other Ambulatory Visit: Payer: Self-pay | Admitting: Cardiovascular Disease

## 2019-03-08 ENCOUNTER — Encounter: Payer: Self-pay | Admitting: Internal Medicine

## 2019-03-08 ENCOUNTER — Other Ambulatory Visit: Payer: Self-pay

## 2019-03-08 ENCOUNTER — Ambulatory Visit (INDEPENDENT_AMBULATORY_CARE_PROVIDER_SITE_OTHER): Payer: 59 | Admitting: Internal Medicine

## 2019-03-08 VITALS — BP 116/70 | HR 80 | Ht 72.0 in | Wt 196.0 lb

## 2019-03-08 DIAGNOSIS — S39012A Strain of muscle, fascia and tendon of lower back, initial encounter: Secondary | ICD-10-CM | POA: Diagnosis not present

## 2019-03-08 DIAGNOSIS — Z23 Encounter for immunization: Secondary | ICD-10-CM | POA: Diagnosis not present

## 2019-03-08 MED ORDER — CYCLOBENZAPRINE HCL 10 MG PO TABS: 10.0000 mg | ORAL_TABLET | Freq: Three times a day (TID) | ORAL | 0 refills | Status: DC | PRN

## 2019-03-08 NOTE — Progress Notes (Signed)
Date:  03/08/2019   Name:  Paul Garner   DOB:  [birth year]   MRN:  [id number]   Chief Complaint: Back Pain  Back Pain  This is a new problem. The current episode started in the past 7 days. The problem occurs constantly. The problem is unchanged. The pain is present in the lumbar spine. The pain does not radiate. The pain is moderate. The symptoms are aggravated by sitting, twisting and bending. Pertinent negatives include no bladder incontinence, bowel incontinence, chest pain, fever, headaches, leg pain, numbness, paresthesias, perianal numbness or weakness. He has tried NSAIDs for the symptoms. The treatment provided mild relief.    Review of Systems  Constitutional: Negative for chills, fatigue and fever.  Respiratory: Negative for chest tightness, shortness of breath and wheezing.   Cardiovascular: Negative for chest pain, palpitations and leg swelling.  Gastrointestinal: Negative for bowel incontinence.  Genitourinary: Negative for bladder incontinence and testicular pain.  Musculoskeletal: Positive for back pain. Negative for joint swelling, myalgias and neck stiffness.  Neurological: Negative for dizziness, weakness, numbness, headaches and paresthesias.    Patient Active Problem List   Diagnosis Date Noted  . Mixed hyperlipidemia 12/07/2018  . IBS (irritable bowel syndrome) 12/07/2018  . Ganglion cyst of dorsum of right wrist 12/07/2018  . Hyperplastic polyp of large intestine   . Major depressive disorder with single episode, in partial remission ([place]) 11/29/2016  . Basal cell carcinoma, arm 07/24/2016  . Gout 07/24/2016  . Arthritis of both hands 07/24/2016  . PVC's (premature ventricular contractions)   . Coronary artery disease 06/22/2013    Allergies  Allergen Reactions  . Vascepa [Icosapent Ethyl]     Worsening diarrhea    Past Surgical History:  Procedure Laterality Date  . CARDIAC CATHETERIZATION  06/22/2013  . COLONOSCOPY WITH PROPOFOL N/A  09/01/2018   Procedure: COLONOSCOPY WITH PROPOFOL;  Surgeon: [name], MD;  Location: [facility] ENDOSCOPY;  Service: Endoscopy;  Laterality: N/A;  . CORONARY ANGIOPLASTY WITH STENT PLACEMENT  06/22/2013   stent placement in the distal RCA & mid RCA  . INCISION AND DRAINAGE PERIRECTAL ABSCESS N/A 04/01/2016   Procedure: IRRIGATION AND DEBRIDEMENT PERIRECTAL ABSCESS;  Surgeon: Christene Lye, MD;  Location: [facility] ORS;  Service: General;  Laterality: N/A;    Social History   Tobacco Use  . Smoking status: Never Smoker  . Smokeless tobacco: Never Used  Substance Use Topics  . Alcohol use: Yes    Comment: 5-10 Beers or Glasses of Wine Weekly  . Drug use: No     Medication list has been reviewed and updated.  Current Meds  Medication Sig  . aspirin EC 81 MG tablet Take 1 tablet (81 mg total) by mouth daily.  . diphenoxylate-atropine (LOMOTIL) 2.5-0.025 MG tablet Take 1 tablet by mouth 4 (four) times daily as needed for diarrhea or loose stools.  Marland Kitchen escitalopram (LEXAPRO) 10 MG tablet TAKE 1 TABLET BY MOUTH EVERY DAY  . Methylcellulose, Laxative, (CITRUCEL PO) Take 1 tablet by mouth daily.  . naproxen sodium (ALEVE) 220 MG tablet Take 220 mg by mouth daily as needed.  . rosuvastatin (CRESTOR) 20 MG tablet TAKE 1 TABLET BY MOUTH EVERY DAY    PHQ 2/9 Scores 12/07/2018 12/02/2017 11/21/2017 11/29/2016  PHQ - 2 Score 0 0 0 0  PHQ- 9 Score - 0 0 -    BP Readings from Last 3 Encounters:  03/08/19 116/70  12/07/18 118/78  10/22/18 127/83    Physical Exam Vitals signs and  nursing note reviewed.  Constitutional:      General: He is not in acute distress.    Appearance: He is well-developed.  HENT:     Head: Normocephalic and atraumatic.  Neck:     Musculoskeletal: Normal range of motion and neck supple.  Cardiovascular:     Rate and Rhythm: Normal rate and regular rhythm.  Pulmonary:     Effort: Pulmonary effort is normal. No respiratory distress.     Breath sounds: Normal breath  sounds.  Musculoskeletal: Normal range of motion.     Lumbar back: He exhibits tenderness and spasm.     Right lower leg: No edema.     Left lower leg: No edema.     Comments: SLR slightly positive on the left; negative on the right   Skin:    General: Skin is warm and dry.     Findings: No rash.  Neurological:     Mental Status: He is alert and oriented to person, place, and time.     Coordination: Coordination is intact.     Gait: Gait is intact.     Deep Tendon Reflexes:     Reflex Scores:      Patellar reflexes are 3+ on the right side and 3+ on the left side. Psychiatric:        Behavior: Behavior normal.        Thought Content: Thought content normal.     Wt Readings from Last 3 Encounters:  03/08/19 196 lb (88.9 kg)  12/07/18 193 lb (87.5 kg)  10/22/18 203 lb 9.6 oz (92.4 kg)    BP 116/70   Pulse 80   Ht 6' (1.829 m)   Wt 196 lb (88.9 kg)   SpO2 98%   BMI 26.58 kg/m   Assessment and Plan: 1. Lumbar strain, initial encounter Motrin 800 mg tid Heat or ice Modify activities - cyclobenzaprine (FLEXERIL) 10 MG tablet; Take 1 tablet (10 mg total) by mouth 3 (three) times daily as needed for muscle spasms.  Dispense: 30 tablet; Refill: 0  2. Need for shingles vaccine Second dose today - Varicella-zoster vaccine IM   Partially dictated using Editor, commissioning. Any errors are unintentional.  Halina Maidens, MD [facility]  03/08/2019

## 2019-03-08 NOTE — Patient Instructions (Signed)
Take Motrin 800 mg three times a day  Take flexeril 10 mg at bedtime daily and twice during the day if tolerated (can also take 1/2 tablet)  Use heat or ice

## 2019-03-10 ENCOUNTER — Ambulatory Visit: Payer: 59

## 2019-05-21 ENCOUNTER — Other Ambulatory Visit: Payer: Self-pay | Admitting: Internal Medicine

## 2019-05-21 DIAGNOSIS — F324 Major depressive disorder, single episode, in partial remission: Secondary | ICD-10-CM

## 2019-05-25 ENCOUNTER — Telehealth: Payer: Self-pay | Admitting: Internal Medicine

## 2019-05-25 NOTE — Telephone Encounter (Signed)
Patient called in requesting referral for arthritis in the shoulder, both hands and possibly else where in the body, he mention that dr berglund seen him before for this problem.

## 2019-05-25 NOTE — Telephone Encounter (Signed)
Please call patient and tell him he needs to schedule an appt with Army Melia for this. He will need Arthritis labs at the appointment and she will need to look at each joint and see what is going on. Thank you!

## 2019-05-26 ENCOUNTER — Encounter: Payer: Self-pay | Admitting: Internal Medicine

## 2019-05-26 ENCOUNTER — Ambulatory Visit (INDEPENDENT_AMBULATORY_CARE_PROVIDER_SITE_OTHER): Payer: 59 | Admitting: Internal Medicine

## 2019-05-26 ENCOUNTER — Other Ambulatory Visit: Payer: Self-pay

## 2019-05-26 VITALS — BP 112/76 | HR 64 | Ht 72.0 in | Wt 198.0 lb

## 2019-05-26 DIAGNOSIS — G8929 Other chronic pain: Secondary | ICD-10-CM

## 2019-05-26 DIAGNOSIS — M19049 Primary osteoarthritis, unspecified hand: Secondary | ICD-10-CM

## 2019-05-26 DIAGNOSIS — R945 Abnormal results of liver function studies: Secondary | ICD-10-CM

## 2019-05-26 DIAGNOSIS — M25511 Pain in right shoulder: Secondary | ICD-10-CM

## 2019-05-26 DIAGNOSIS — R7989 Other specified abnormal findings of blood chemistry: Secondary | ICD-10-CM

## 2019-05-26 NOTE — Progress Notes (Signed)
Date:  05/26/2019   Name:  Paul Garner   DOB:  [birth year]   MRN:  [id number]   Chief Complaint: Generalized Body Aches (Patient would like to be referred to doctor for arthritis. Joints ache - shoulder, both hands, big toe on left foot. knuckles swell at times. X 2 months)  Shoulder Pain  The pain is present in the right shoulder. This is a new problem. The current episode started more than 1 month ago. There has been no history of extremity trauma. The problem occurs daily. The problem has been unchanged. The quality of the pain is described as aching. The pain is mild. Associated symptoms include a limited range of motion. Pertinent negatives include no fever, joint swelling, numbness, stiffness or tingling. The symptoms are aggravated by activity and lying down. He has tried NSAIDS for the symptoms. The treatment provided mild relief. hx of partial left shoulder rotator cuff tear  Hand Pain  There was no injury mechanism. The pain is present in the left hand and right hand. The quality of the pain is described as aching and cramping. The pain does not radiate. The pain is moderate. The pain has been fluctuating (worse in the AM for about 30 minutes then improves with movement but still sx throughout the day) since the incident. Pertinent negatives include no chest pain, numbness or tingling. He has tried NSAIDs for the symptoms. The treatment provided mild relief.  Elevated liver function tests - noted on exam last February.  Pt advised to limit nsaids and alcohol.  However, he has been taking Aleve and Advil recently for the joint pains.  Review of Systems  Constitutional: Negative for chills, fatigue, fever and unexpected weight change.  Respiratory: Negative for chest tightness and shortness of breath.   Cardiovascular: Negative for chest pain, palpitations and leg swelling.  Musculoskeletal: Positive for arthralgias and joint swelling. Negative for stiffness.  Neurological: Negative  for dizziness, tingling, weakness and numbness.    Patient Active Problem List   Diagnosis Date Noted  . Mixed hyperlipidemia 12/07/2018  . IBS (irritable bowel syndrome) 12/07/2018  . Ganglion cyst of dorsum of right wrist 12/07/2018  . Hyperplastic polyp of large intestine   . Major depressive disorder with single episode, in partial remission ([place]) 11/29/2016  . Basal cell carcinoma, arm 07/24/2016  . Gout 07/24/2016  . Arthritis of both hands 07/24/2016  . PVC's (premature ventricular contractions)   . Coronary artery disease 06/22/2013    Allergies  Allergen Reactions  . Vascepa [Icosapent Ethyl]     Worsening diarrhea    Past Surgical History:  Procedure Laterality Date  . CARDIAC CATHETERIZATION  06/22/2013  . COLONOSCOPY WITH PROPOFOL N/A 09/01/2018   Procedure: COLONOSCOPY WITH PROPOFOL;  Surgeon: [name], MD;  Location: [facility] ENDOSCOPY;  Service: Endoscopy;  Laterality: N/A;  . CORONARY ANGIOPLASTY WITH STENT PLACEMENT  06/22/2013   stent placement in the distal RCA & mid RCA  . INCISION AND DRAINAGE PERIRECTAL ABSCESS N/A 04/01/2016   Procedure: IRRIGATION AND DEBRIDEMENT PERIRECTAL ABSCESS;  Surgeon: Christene Lye, MD;  Location: [facility] ORS;  Service: General;  Laterality: N/A;    Social History   Tobacco Use  . Smoking status: Never Smoker  . Smokeless tobacco: Never Used  Substance Use Topics  . Alcohol use: Yes    Comment: 5-10 Beers or Glasses of Wine Weekly  . Drug use: No     Medication list has been reviewed and updated.  Current Meds  Medication Sig  . aspirin EC 81 MG tablet Take 1 tablet (81 mg total) by mouth daily.  . cyclobenzaprine (FLEXERIL) 10 MG tablet Take 1 tablet (10 mg total) by mouth 3 (three) times daily as needed for muscle spasms.  . diphenoxylate-atropine (LOMOTIL) 2.5-0.025 MG tablet Take 1 tablet by mouth 4 (four) times daily as needed for diarrhea or loose stools.  Marland Kitchen escitalopram (LEXAPRO) 10 MG tablet TAKE 1 TABLET  BY MOUTH EVERY DAY  . Methylcellulose, Laxative, (CITRUCEL PO) Take 1 tablet by mouth daily.  . naproxen sodium (ALEVE) 220 MG tablet Take 220 mg by mouth daily as needed.  . rosuvastatin (CRESTOR) 20 MG tablet TAKE 1 TABLET BY MOUTH EVERY DAY    PHQ 2/9 Scores 05/26/2019 05/26/2019 12/07/2018 12/02/2017  PHQ - 2 Score 0 0 0 0  PHQ- 9 Score 0 - - 0    BP Readings from Last 3 Encounters:  05/26/19 112/76  03/08/19 116/70  12/07/18 118/78    Physical Exam Vitals signs and nursing note reviewed.  Constitutional:      General: He is not in acute distress.    Appearance: He is well-developed.  HENT:     Head: Normocephalic and atraumatic.  Cardiovascular:     Rate and Rhythm: Normal rate and regular rhythm.     Pulses: Normal pulses.  Pulmonary:     Effort: Pulmonary effort is normal. No respiratory distress.     Breath sounds: No wheezing or rhonchi.  Musculoskeletal:     Right shoulder: He exhibits decreased range of motion and tenderness. He exhibits no swelling and no effusion.     Comments: Mild synovitis of MCP joints bilaterally  Lymphadenopathy:     Cervical: No cervical adenopathy.  Skin:    General: Skin is warm and dry.     Findings: No rash.  Neurological:     Mental Status: He is alert and oriented to person, place, and time.     Sensory: Sensation is intact.     Motor: Motor function is intact.     Coordination: Coordination is intact.  Psychiatric:        Behavior: Behavior normal.        Thought Content: Thought content normal.     Wt Readings from Last 3 Encounters:  05/26/19 198 lb (89.8 kg)  03/08/19 196 lb (88.9 kg)  12/07/18 193 lb (87.5 kg)    BP 112/76   Pulse 64   Ht 6' (1.829 m)   Wt 198 lb (89.8 kg)   SpO2 97%   BMI 26.85 kg/m   Assessment and Plan: 1. Arthritis of hand Suspect mild RA - if needed will refer to Rheumatology - ANA w/Reflex if Positive - Rheumatoid factor - CYCLIC CITRUL PEPTIDE ANTIBODY, IGG/IGA - CBC with  Differential/Platelet  2. Elevated LFTs Recheck labs May need [place] - Comprehensive metabolic panel  3. Chronic right shoulder pain - Ambulatory referral to Orthopedic Surgery   Partially dictated using Dragon software. Any errors are unintentional.  Halina Maidens, MD [facility]  05/26/2019

## 2019-05-28 LAB — CBC WITH DIFFERENTIAL/PLATELET
Basophils Absolute: 0.1 10*3/uL (ref 0.0–0.2)
Basos: 1 %
EOS (ABSOLUTE): 0.2 10*3/uL (ref 0.0–0.4)
Eos: 3 %
Hematocrit: 43.4 % (ref 37.5–51.0)
Hemoglobin: 15.1 g/dL (ref 13.0–17.7)
Immature Grans (Abs): 0.1 10*3/uL (ref 0.0–0.1)
Immature Granulocytes: 2 %
Lymphocytes Absolute: 1.9 10*3/uL (ref 0.7–3.1)
Lymphs: 32 %
MCH: 31.8 pg (ref 26.6–33.0)
MCHC: 34.8 g/dL (ref 31.5–35.7)
MCV: 91 fL (ref 79–97)
Monocytes Absolute: 0.6 10*3/uL (ref 0.1–0.9)
Monocytes: 9 %
Neutrophils Absolute: 3.3 10*3/uL (ref 1.4–7.0)
Neutrophils: 53 %
Platelets: 226 10*3/uL (ref 150–450)
RBC: 4.75 x10E6/uL (ref 4.14–5.80)
RDW: 12.9 % (ref 11.6–15.4)
WBC: 6 10*3/uL (ref 3.4–10.8)

## 2019-05-28 LAB — COMPREHENSIVE METABOLIC PANEL
ALT: 27 IU/L (ref 0–44)
AST: 22 IU/L (ref 0–40)
Albumin/Globulin Ratio: 2.4 — ABNORMAL HIGH (ref 1.2–2.2)
Albumin: 5 g/dL — ABNORMAL HIGH (ref 3.8–4.9)
Alkaline Phosphatase: 67 IU/L (ref 39–117)
BUN/Creatinine Ratio: 14 (ref 10–24)
BUN: 15 mg/dL (ref 8–27)
Bilirubin Total: 0.3 mg/dL (ref 0.0–1.2)
CO2: 21 mmol/L (ref 20–29)
Calcium: 9.5 mg/dL (ref 8.6–10.2)
Chloride: 103 mmol/L (ref 96–106)
Creatinine, Ser: 1.04 mg/dL (ref 0.76–1.27)
GFR calc Af Amer: 90 mL/min/{1.73_m2} (ref >59)
GFR calc non Af Amer: 78 mL/min/{1.73_m2} (ref >59)
Globulin, Total: 2.1 g/dL (ref 1.5–4.5)
Glucose: 89 mg/dL (ref 65–99)
Potassium: 4.5 mmol/L (ref 3.5–5.2)
Sodium: 140 mmol/L (ref 134–144)
Total Protein: 7.1 g/dL (ref 6.0–8.5)

## 2019-05-28 LAB — RHEUMATOID FACTOR: Rhuematoid fact SerPl-aCnc: <10 IU/mL (ref 0.0–13.9)

## 2019-05-28 LAB — CYCLIC CITRUL PEPTIDE ANTIBODY, IGG/IGA: Cyclic Citrullin Peptide Ab: 5 units (ref 0–19)

## 2019-05-28 LAB — ANA W/REFLEX IF POSITIVE: Anti Nuclear Antibody (ANA): NEGATIVE

## 2019-05-31 ENCOUNTER — Telehealth: Payer: Self-pay | Admitting: Internal Medicine

## 2019-05-31 NOTE — Telephone Encounter (Signed)
Patient called about results

## 2019-06-02 NOTE — Telephone Encounter (Signed)
Patient informed of lab results and told to wait for my call next week for further instructions from Dr. Army Melia about joint pain.

## 2019-06-07 ENCOUNTER — Other Ambulatory Visit: Payer: Self-pay | Admitting: Internal Medicine

## 2019-06-07 DIAGNOSIS — M19049 Primary osteoarthritis, unspecified hand: Secondary | ICD-10-CM

## 2019-06-07 MED ORDER — MELOXICAM 15 MG PO TABS: 15.0000 mg | ORAL_TABLET | Freq: Every day | ORAL | 2 refills | Status: DC

## 2019-06-07 NOTE — Progress Notes (Signed)
Patient informed. 

## 2019-06-07 NOTE — Progress Notes (Signed)
Left message for pt to call the office back. Awaiting call.

## 2019-06-07 NOTE — Progress Notes (Signed)
Patient would like to try Meloxicam. [facility] [place].   Thank you.

## 2019-06-23 ENCOUNTER — Telehealth: Payer: Self-pay

## 2019-06-23 NOTE — Telephone Encounter (Signed)
Patient called wanting to speak with you about his new finding from Dr. Phillip Heal for melanoma.  Please advise.

## 2019-06-23 NOTE — Telephone Encounter (Signed)
I spoke with patient by phone.  He has a skin lesion removed on his back that came back as melanoma.  He goes on Monday to have a wider excision by Dr. Phillip Heal.   I advised him that after that specimen returns from pathology, plans or referrals can be made as needed.  He expressed understanding.

## 2019-07-04 ENCOUNTER — Other Ambulatory Visit: Payer: Self-pay | Admitting: Gastroenterology

## 2019-07-08 ENCOUNTER — Other Ambulatory Visit: Payer: Self-pay

## 2019-09-07 ENCOUNTER — Other Ambulatory Visit: Payer: Self-pay | Admitting: Internal Medicine

## 2019-09-07 DIAGNOSIS — M19049 Primary osteoarthritis, unspecified hand: Secondary | ICD-10-CM

## 2019-09-21 ENCOUNTER — Ambulatory Visit (INDEPENDENT_AMBULATORY_CARE_PROVIDER_SITE_OTHER): Payer: 59 | Admitting: Cardiovascular Disease

## 2019-09-21 ENCOUNTER — Other Ambulatory Visit: Payer: Self-pay

## 2019-09-21 ENCOUNTER — Encounter: Payer: Self-pay | Admitting: Cardiovascular Disease

## 2019-09-21 VITALS — BP 138/82 | HR 70 | Ht 72.0 in | Wt 202.5 lb

## 2019-09-21 DIAGNOSIS — I493 Ventricular premature depolarization: Secondary | ICD-10-CM

## 2019-09-21 DIAGNOSIS — I2510 Atherosclerotic heart disease of native coronary artery without angina pectoris: Secondary | ICD-10-CM

## 2019-09-21 DIAGNOSIS — E782 Mixed hyperlipidemia: Secondary | ICD-10-CM

## 2019-09-21 NOTE — Progress Notes (Signed)
Cardiology Office Note   Date:  09/21/2019   ID:  Paul Garner, DOB [id number], MRN [id number]  PCP:  Glean Hess, MD  Cardiologist:   Kathlyn Sacramento, MD   Chief Complaint  Patient presents with  . other    12 month follow up. Meds reviewed verbally with patient.       History of Present Illness: Paul Garner is a 63 y.o. male who is here today for a follow-up visit regarding coronary artery disease and PVCs. He has known history of coronary artery disease with previous stenting of the right coronary artery in 2014 for stable angina. No previous myocardial infarction. Other medical problems include PVCs, hyperlipidemia and gout. He is a lifelong nonsmoker. He has extensive family history of coronary artery disease.   Most recent ischemic cardiac evaluation was done in May 2019 for atypical chest pain. Treadmill nuclear stress test  showed no evidence of ischemia with normal ejection fraction.  He was able to exercise for 11 minutes. He is known to have mixed hyperlipidemia but did not tolerate Vascepa in the past due to diarrhea.  He has been doing very well with no recent chest pain, shortness of breath or dizziness.  He continues to have intermittent palpitations.   Past Medical History:  Diagnosis Date  . Coronary artery disease    a. 05/2013 s/p PCI/DES to the mid/distal RCA;  b. 11/2015 MV: EF 51%, no ischemia/infarct.  . Hyperlipidemia   . PVC's (premature ventricular contractions)     Past Surgical History:  Procedure Laterality Date  . CARDIAC CATHETERIZATION  06/22/2013  . COLONOSCOPY WITH PROPOFOL N/A 09/01/2018   Procedure: COLONOSCOPY WITH PROPOFOL;  Surgeon: [name], MD;  Location: [facility] ENDOSCOPY;  Service: Endoscopy;  Laterality: N/A;  . CORONARY ANGIOPLASTY WITH STENT PLACEMENT  06/22/2013   stent placement in the distal RCA & mid RCA  . INCISION AND DRAINAGE PERIRECTAL ABSCESS N/A 04/01/2016   Procedure: IRRIGATION AND DEBRIDEMENT  PERIRECTAL ABSCESS;  Surgeon: Christene Lye, MD;  Location: [facility] ORS;  Service: General;  Laterality: N/A;     Current Outpatient Medications  Medication Sig Dispense Refill  . aspirin EC 81 MG tablet Take 1 tablet (81 mg total) by mouth daily.    . cyclobenzaprine (FLEXERIL) 10 MG tablet Take 1 tablet (10 mg total) by mouth 3 (three) times daily as needed for muscle spasms. 30 tablet 0  . diphenoxylate-atropine (LOMOTIL) 2.5-0.025 MG tablet TAKE 1 TO 2 TABLETS BY MOUTH 4 TIMES A DAY AS NEEDED FOR DIARRHEA/LOOSE STOOL 90 tablet 5  . escitalopram (LEXAPRO) 10 MG tablet TAKE 1 TABLET BY MOUTH EVERY DAY 90 tablet 3  . meloxicam (MOBIC) 15 MG tablet TAKE 1 TABLET BY MOUTH EVERY DAY 30 tablet 2  . Methylcellulose, Laxative, (CITRUCEL PO) Take 1 tablet by mouth daily.    . naproxen sodium (ALEVE) 220 MG tablet Take 220 mg by mouth daily as needed.    . rosuvastatin (CRESTOR) 20 MG tablet TAKE 1 TABLET BY MOUTH EVERY DAY 90 tablet 2   No current facility-administered medications for this visit.     Allergies:   Vascepa [icosapent ethyl]    Social History:  The patient  reports that he has never smoked. He has never used smokeless tobacco. He reports current alcohol use. He reports that he does not use drugs.   Family History:  The patient's family history includes Cancer (age of onset: 65) in his mother; Heart attack (age of  onset: 61) in his father; Heart disease in his father; Hyperlipidemia in his father; Rheum arthritis in his paternal grandmother.    ROS:  Please see the history of present illness.   Otherwise, review of systems are positive for none.   All other systems are reviewed and negative.    PHYSICAL EXAM: VS:  BP 138/82 (BP Location: Left Arm, Patient Position: Sitting, Cuff Size: Normal)   Pulse 70   Ht 6' (1.829 m)   Wt 202 lb 8 oz (91.9 kg)   BMI 27.46 kg/m  , BMI Body mass index is 27.46 kg/m. GEN: Well nourished, well developed, in no acute distress  HEENT:  normal  Neck: no JVD, carotid bruits, or masses Cardiac: RRR; no murmurs, rubs, or gallops,no edema  Respiratory:  clear to auscultation bilaterally, normal work of breathing GI: soft, nontender, nondistended, + BS MS: no deformity or atrophy  Skin: warm and dry, no rash Neuro:  Strength and sensation are intact Psych: euthymic mood, full affect   EKG:  EKG is ordered today. EKG shows normal sinus rhythm with no significant ST or T wave changes.   Recent Labs: 12/07/2018: TSH 2.180 05/26/2019: ALT 27; BUN 15; Creatinine, Ser 1.04; Hemoglobin 15.1; Platelets 226; Potassium 4.5; Sodium 140    Lipid Panel    Component Value Date/Time   CHOL 172 12/07/2018 0854   TRIG 393 (H) 12/07/2018 0854   HDL 43 12/07/2018 0854   CHOLHDL 4.0 12/07/2018 0854   CHOLHDL 3.3 12/12/2015 1007   VLDL 29 12/12/2015 1007   LDLCALC 50 12/07/2018 0854      Wt Readings from Last 3 Encounters:  09/21/19 202 lb 8 oz (91.9 kg)  05/26/19 198 lb (89.8 kg)  03/08/19 196 lb (88.9 kg)        ASSESSMENT AND PLAN:  1.  Coronary artery disease involving native coronary arteries without angina: He is overall doing well with no anginal symptoms. Continue low-dose aspirin.   2.  Mixed hyperlipidemia: I reviewed most recent lipid profile done in February which showed an LDL of 50.  His triglyceride was 393.  He did not tolerate Vascepa due to GI side effects.  I discussed with him the importance of healthy lifestyle changes including diet and regular exercise.  Continue treatment with rosuvastatin.  He did have elevated LFTs but these were normal on subsequent testing.  3. PVCs: The burden was not significant enough to require treatment.  His palpitations are very mild.   Disposition:   FU with me in 12 months  Signed,  Kathlyn Sacramento, MD  09/21/2019 4:29 PM    [place]

## 2019-09-21 NOTE — Patient Instructions (Signed)
Medication Instructions:  Your physician recommends that you continue on your current medications as directed. Please refer to the Current Medication list given to you today.  *If you need a refill on your cardiac medications before your next appointment, please call your pharmacy*  Lab Work: None ordered If you have labs (blood work) drawn today and your tests are completely normal, you will receive your results only by: Marland Kitchen MyChart Message (if you have MyChart) OR . A paper copy in the mail If you have any lab test that is abnormal or we need to change your treatment, we will call you to review the results.  Testing/Procedures: None ordered  Follow-Up: At [facility], you and your health needs are our priority.  As part of our continuing mission to provide you with exceptional heart care, we have created designated Provider Care Teams.  These Care Teams include your primary Cardiologist (physician) and Advanced Practice Providers (APPs -  Physician Assistants and Nurse Practitioners) who all work together to provide you with the care you need, when you need it.  Your next appointment:   12 month(s)  The format for your next appointment:   In Person  Provider:    You may see Kathlyn Sacramento, MD or one of the following Advanced Practice Providers on your designated Care Team:    Murray Hodgkins, NP  Christell Faith, PA-C  Marrianne Mood, PA-C   Other Instructions N/A

## 2019-11-18 ENCOUNTER — Ambulatory Visit: Payer: No Typology Code available for payment source | Attending: Internal Medicine

## 2019-11-18 DIAGNOSIS — Z20822 Contact with and (suspected) exposure to covid-19: Secondary | ICD-10-CM

## 2019-11-19 LAB — NOVEL CORONAVIRUS, NAA: SARS-CoV-2, NAA: NOT DETECTED

## 2019-11-30 ENCOUNTER — Other Ambulatory Visit: Payer: Self-pay | Admitting: Cardiovascular Disease

## 2019-12-10 ENCOUNTER — Encounter: Payer: Self-pay | Admitting: Internal Medicine

## 2019-12-10 ENCOUNTER — Ambulatory Visit (INDEPENDENT_AMBULATORY_CARE_PROVIDER_SITE_OTHER): Payer: No Typology Code available for payment source | Admitting: Internal Medicine

## 2019-12-10 ENCOUNTER — Other Ambulatory Visit: Payer: Self-pay

## 2019-12-10 VITALS — BP 130/82 | HR 65 | Temp 98.0°F | Ht 72.0 in | Wt 204.0 lb

## 2019-12-10 DIAGNOSIS — Z0000 Encounter for general adult medical examination without abnormal findings: Secondary | ICD-10-CM

## 2019-12-10 DIAGNOSIS — Z125 Encounter for screening for malignant neoplasm of prostate: Secondary | ICD-10-CM

## 2019-12-10 DIAGNOSIS — F324 Major depressive disorder, single episode, in partial remission: Secondary | ICD-10-CM | POA: Diagnosis not present

## 2019-12-10 DIAGNOSIS — I2510 Atherosclerotic heart disease of native coronary artery without angina pectoris: Secondary | ICD-10-CM

## 2019-12-10 DIAGNOSIS — D0359 Melanoma in situ of other part of trunk: Secondary | ICD-10-CM

## 2019-12-10 DIAGNOSIS — Z86006 Personal history of melanoma in-situ: Secondary | ICD-10-CM | POA: Insufficient documentation

## 2019-12-10 DIAGNOSIS — E782 Mixed hyperlipidemia: Secondary | ICD-10-CM

## 2019-12-10 DIAGNOSIS — I25119 Atherosclerotic heart disease of native coronary artery with unspecified angina pectoris: Secondary | ICD-10-CM

## 2019-12-10 LAB — POCT URINALYSIS DIPSTICK
Bilirubin, UA: NEGATIVE
Blood, UA: NEGATIVE
Glucose, UA: NEGATIVE
Ketones, UA: NEGATIVE
Leukocytes, UA: NEGATIVE
Nitrite, UA: NEGATIVE
Protein, UA: NEGATIVE
Spec Grav, UA: 1.015 (ref 1.010–1.025)
Urobilinogen, UA: 0.2 U/dL
pH, UA: 6 (ref 5.0–8.0)

## 2019-12-10 NOTE — Patient Instructions (Signed)
Covid Vaccine locations: [employer] Dept. [place]  [email or web address]

## 2019-12-10 NOTE — Progress Notes (Signed)
Date:  12/10/2019   Name:  Paul Garner   DOB:  [id number]   MRN:  [id number]   Chief Complaint: Annual Exam Paul Garner is a 64 y.o. male who presents today for his Complete Annual Exam. He feels well. He reports exercising rarely. He reports he is sleeping fairly well.   Colonoscopy 2019 Immunization History  Administered Date(s) Administered  . Influenza Inj Mdck Quad With Preservative 08/26/2018  . Influenza, Quadrivalent, Recombinant, Inj, Pf 08/25/2019  . Influenza,inj,Quad PF,6+ Mos 09/04/2016  . Influenza-Unspecified 08/25/2019  . Zoster Recombinat (Shingrix) 12/07/2018, 03/08/2019    Depression        This is a chronic problem.  Associated symptoms include no fatigue, no appetite change, no myalgias and no headaches.  Past treatments include SSRIs - Selective serotonin reuptake inhibitors.  Compliance with treatment is good. Heart Problem This is a chronic (stent 2014) problem. Associated symptoms include arthralgias. Pertinent negatives include no abdominal pain, chest pain, chills, diaphoresis, fatigue, headaches, myalgias or rash.  Hyperlipidemia This is a chronic problem. The problem is controlled. Pertinent negatives include no chest pain, myalgias or shortness of breath. Current antihyperlipidemic treatment includes statins. The current treatment provides significant improvement of lipids.    Lab Results  Component Value Date   CREATININE 1.04 05/26/2019   BUN 15 05/26/2019   NA 140 05/26/2019   K 4.5 05/26/2019   CL 103 05/26/2019   CO2 21 05/26/2019   Lab Results  Component Value Date   CHOL 172 12/07/2018   HDL 43 12/07/2018   LDLCALC 50 12/07/2018   TRIG 393 (H) 12/07/2018   CHOLHDL 4.0 12/07/2018   Lab Results  Component Value Date   TSH 2.180 12/07/2018   No results found for: HGBA1C   Review of Systems  Constitutional: Negative for appetite change, chills, diaphoresis, fatigue and unexpected weight change.  HENT: Negative for  hearing loss, tinnitus, trouble swallowing and voice change.   Eyes: Negative for visual disturbance.  Respiratory: Negative for choking, shortness of breath and wheezing.   Cardiovascular: Positive for palpitations (ocassional). Negative for chest pain and leg swelling.  Gastrointestinal: Negative for abdominal pain, blood in stool, constipation and diarrhea.  Genitourinary: Negative for difficulty urinating, dysuria and frequency.  Musculoskeletal: Positive for arthralgias. Negative for back pain (intermittent left shoulder) and myalgias.  Skin: Negative for color change and rash.  Neurological: Negative for dizziness, syncope and headaches.  Hematological: Negative for adenopathy.  Psychiatric/Behavioral: Positive for depression. Negative for dysphoric mood and sleep disturbance.    Patient Active Problem List   Diagnosis Date Noted  . Mixed hyperlipidemia 12/07/2018  . IBS (irritable bowel syndrome) 12/07/2018  . Ganglion cyst of dorsum of right wrist 12/07/2018  . Hyperplastic polyp of large intestine   . Major depressive disorder with single episode, in partial remission ([place]) 11/29/2016  . Basal cell carcinoma, arm 07/24/2016  . Gout 07/24/2016  . Arthritis of both hands 07/24/2016  . PVC's (premature ventricular contractions)   . Coronary artery disease 06/22/2013    Allergies  Allergen Reactions  . Vascepa [Icosapent Ethyl]     Worsening diarrhea    Past Surgical History:  Procedure Laterality Date  . CARDIAC CATHETERIZATION  06/22/2013  . COLONOSCOPY WITH PROPOFOL N/A 09/01/2018   Procedure: COLONOSCOPY WITH PROPOFOL;  Surgeon: [name], MD;  Location: [facility] ENDOSCOPY;  Service: Endoscopy;  Laterality: N/A;  . CORONARY ANGIOPLASTY WITH STENT PLACEMENT  06/22/2013   stent placement in the distal RCA &  mid RCA  . INCISION AND DRAINAGE PERIRECTAL ABSCESS N/A 04/01/2016   Procedure: IRRIGATION AND DEBRIDEMENT PERIRECTAL ABSCESS;  Surgeon: Christene Lye, MD;   Location: [facility] ORS;  Service: General;  Laterality: N/A;    Social History   Tobacco Use  . Smoking status: Never Smoker  . Smokeless tobacco: Never Used  Substance Use Topics  . Alcohol use: Yes    Comment: 5-10 Beers or Glasses of Wine Weekly  . Drug use: No     Medication list has been reviewed and updated.  Current Meds  Medication Sig  . aspirin EC 81 MG tablet Take 1 tablet (81 mg total) by mouth daily.  . cyclobenzaprine (FLEXERIL) 10 MG tablet Take 1 tablet (10 mg total) by mouth 3 (three) times daily as needed for muscle spasms.  . cycloSPORINE (RESTASIS) 0.05 % ophthalmic emulsion Place 1 drop into both eyes 2 (two) times daily.  . diphenoxylate-atropine (LOMOTIL) 2.5-0.025 MG tablet TAKE 1 TO 2 TABLETS BY MOUTH 4 TIMES A DAY AS NEEDED FOR DIARRHEA/LOOSE STOOL  . escitalopram (LEXAPRO) 10 MG tablet TAKE 1 TABLET BY MOUTH EVERY DAY  . meloxicam (MOBIC) 15 MG tablet TAKE 1 TABLET BY MOUTH EVERY DAY  . Methylcellulose, Laxative, (CITRUCEL PO) Take 1 tablet by mouth daily.  . naproxen sodium (ALEVE) 220 MG tablet Take 220 mg by mouth daily as needed.  . rosuvastatin (CRESTOR) 20 MG tablet TAKE 1 TABLET BY MOUTH EVERY DAY    PHQ 2/9 Scores 12/10/2019 05/26/2019 05/26/2019 12/07/2018  PHQ - 2 Score 0 0 0 0  PHQ- 9 Score 0 0 - -    BP Readings from Last 3 Encounters:  12/10/19 130/82  09/21/19 138/82  05/26/19 112/76    Physical Exam Vitals and nursing note reviewed.  Constitutional:      Appearance: Normal appearance. He is well-developed.  HENT:     Head: Normocephalic.     Right Ear: Tympanic membrane, ear canal and external ear normal.     Left Ear: Tympanic membrane, ear canal and external ear normal.     Nose: Nose normal.     Mouth/Throat:     Pharynx: Uvula midline.  Eyes:     Conjunctiva/sclera: Conjunctivae normal.     Pupils: Pupils are equal, round, and reactive to light.  Neck:     Thyroid: No thyromegaly.     Vascular: No carotid bruit.    Cardiovascular:     Rate and Rhythm: Normal rate and regular rhythm.     Heart sounds: Normal heart sounds.  Pulmonary:     Effort: Pulmonary effort is normal.     Breath sounds: Normal breath sounds. No wheezing.  Chest:     Breasts:        Right: No mass.        Left: No mass.  Abdominal:     General: Bowel sounds are normal.     Palpations: Abdomen is soft.     Tenderness: There is no abdominal tenderness.  Musculoskeletal:        General: Normal range of motion.     Cervical back: Normal range of motion and neck supple.  Lymphadenopathy:     Cervical: No cervical adenopathy.  Skin:    General: Skin is warm and dry.  Neurological:     Mental Status: He is alert and oriented to person, place, and time.     Deep Tendon Reflexes: Reflexes are normal and symmetric.  Psychiatric:  Speech: Speech normal.        Behavior: Behavior normal.        Thought Content: Thought content normal.        Judgment: Judgment normal.     Wt Readings from Last 3 Encounters:  12/10/19 204 lb (92.5 kg)  09/21/19 202 lb 8 oz (91.9 kg)  05/26/19 198 lb (89.8 kg)    BP 130/82   Pulse 65   Temp 98 F (36.7 C) (Oral)   Ht 6' (1.829 m)   Wt 204 lb (92.5 kg)   SpO2 99%   BMI 27.67 kg/m   Assessment and Plan: 1. Annual physical exam Normal exam Continue healthy diet, exercise. - POCT urinalysis dipstick  2. Prostate cancer screening DRE deferred - PSA  3. Coronary artery disease involving native coronary artery of native heart without angina pectoris Stable without anginal symptoms Followed annually by cardiology - CBC with Differential/Platelet  4. Mixed hyperlipidemia Tolerating statin medication without side effects at this time LDL is at goal of < 70 on current dose Continue same therapy without change at this time. - Comprehensive metabolic panel - Lipid panel  5. Major depressive disorder with single episode, in partial remission ([place]) Clinically stable and  doing well on current medications. No side effect or other medication related issues, no SI/HI. Will continue Lexapro 10 mg. - TSH  6. Melanoma in situ of back ([place]) Removed last October in full Followed by Dermatology every 6 months.   Partially dictated using Editor, commissioning. Any errors are unintentional.  Halina Maidens, MD [facility]  12/10/2019

## 2019-12-11 LAB — LIPID PANEL
Chol/HDL Ratio: 3.3 ratio (ref 0.0–5.0)
Cholesterol, Total: 164 mg/dL (ref 100–199)
HDL: 50 mg/dL (ref >39)
LDL Chol Calc (NIH): 79 mg/dL (ref 0–99)
Triglycerides: 210 mg/dL — ABNORMAL HIGH (ref 0–149)
VLDL Cholesterol Cal: 35 mg/dL (ref 5–40)

## 2019-12-11 LAB — COMPREHENSIVE METABOLIC PANEL
ALT: 32 IU/L (ref 0–44)
AST: 27 IU/L (ref 0–40)
Albumin/Globulin Ratio: 2 (ref 1.2–2.2)
Albumin: 4.8 g/dL (ref 3.8–4.8)
Alkaline Phosphatase: 68 IU/L (ref 39–117)
BUN/Creatinine Ratio: 14 (ref 10–24)
BUN: 14 mg/dL (ref 8–27)
Bilirubin Total: 0.4 mg/dL (ref 0.0–1.2)
CO2: 22 mmol/L (ref 20–29)
Calcium: 9.7 mg/dL (ref 8.6–10.2)
Chloride: 102 mmol/L (ref 96–106)
Creatinine, Ser: 1.01 mg/dL (ref 0.76–1.27)
GFR calc Af Amer: 92 mL/min/{1.73_m2} (ref >59)
GFR calc non Af Amer: 80 mL/min/{1.73_m2} (ref >59)
Globulin, Total: 2.4 g/dL (ref 1.5–4.5)
Glucose: 96 mg/dL (ref 65–99)
Potassium: 4.5 mmol/L (ref 3.5–5.2)
Sodium: 139 mmol/L (ref 134–144)
Total Protein: 7.2 g/dL (ref 6.0–8.5)

## 2019-12-11 LAB — CBC WITH DIFFERENTIAL/PLATELET
Basophils Absolute: 0.1 10*3/uL (ref 0.0–0.2)
Basos: 1 %
EOS (ABSOLUTE): 0.2 10*3/uL (ref 0.0–0.4)
Eos: 3 %
Hematocrit: 46.5 % (ref 37.5–51.0)
Hemoglobin: 15.8 g/dL (ref 13.0–17.7)
Immature Grans (Abs): 0.1 10*3/uL (ref 0.0–0.1)
Immature Granulocytes: 2 %
Lymphocytes Absolute: 1.7 10*3/uL (ref 0.7–3.1)
Lymphs: 28 %
MCH: 31.3 pg (ref 26.6–33.0)
MCHC: 34 g/dL (ref 31.5–35.7)
MCV: 92 fL (ref 79–97)
Monocytes Absolute: 0.4 10*3/uL (ref 0.1–0.9)
Monocytes: 7 %
Neutrophils Absolute: 3.6 10*3/uL (ref 1.4–7.0)
Neutrophils: 59 %
Platelets: 251 10*3/uL (ref 150–450)
RBC: 5.05 x10E6/uL (ref 4.14–5.80)
RDW: 12.4 % (ref 11.6–15.4)
WBC: 6.1 10*3/uL (ref 3.4–10.8)

## 2019-12-11 LAB — TSH: TSH: 2.28 u[IU]/mL (ref 0.450–4.500)

## 2019-12-11 LAB — PSA: Prostate Specific Ag, Serum: 0.8 ng/mL (ref 0.0–4.0)

## 2019-12-12 ENCOUNTER — Other Ambulatory Visit: Payer: Self-pay | Admitting: Internal Medicine

## 2019-12-12 DIAGNOSIS — M19049 Primary osteoarthritis, unspecified hand: Secondary | ICD-10-CM

## 2019-12-21 ENCOUNTER — Other Ambulatory Visit: Payer: Self-pay | Admitting: *Deleted

## 2019-12-21 MED ORDER — ROSUVASTATIN CALCIUM 20 MG PO TABS: 20.0000 mg | ORAL_TABLET | Freq: Every day | ORAL | 2 refills | Status: DC

## 2019-12-22 ENCOUNTER — Other Ambulatory Visit: Payer: Self-pay

## 2019-12-22 DIAGNOSIS — F324 Major depressive disorder, single episode, in partial remission: Secondary | ICD-10-CM

## 2019-12-22 DIAGNOSIS — M19049 Primary osteoarthritis, unspecified hand: Secondary | ICD-10-CM

## 2019-12-22 MED ORDER — MELOXICAM 15 MG PO TABS: 15.0000 mg | ORAL_TABLET | Freq: Every day | ORAL | 1 refills | Status: DC

## 2019-12-22 MED ORDER — ESCITALOPRAM OXALATE 10 MG PO TABS: 10.0000 mg | ORAL_TABLET | Freq: Every day | ORAL | 1 refills | Status: DC

## 2020-01-14 ENCOUNTER — Ambulatory Visit: Payer: 59 | Attending: Internal Medicine

## 2020-01-14 ENCOUNTER — Other Ambulatory Visit: Payer: Self-pay

## 2020-01-14 DIAGNOSIS — Z23 Encounter for immunization: Secondary | ICD-10-CM

## 2020-01-14 NOTE — Progress Notes (Signed)
   Covid-19 Vaccination Clinic  Name:  Paul Garner    MRN: [id number] DOB: 10-07-1956  01/14/2020  Paul Garner was observed post Covid-19 immunization for 15 minutes without incident. He was provided with Vaccine Information Sheet and instruction to access the V-Safe system.   Paul Garner was instructed to call 911 with any severe reactions post vaccine: Marland Kitchen Difficulty breathing  . Swelling of face and throat  . A fast heartbeat  . A bad rash all over body  . Dizziness and weakness   Immunizations Administered    Name Date Dose VIS Date Route   Pfizer COVID-19 Vaccine 01/14/2020  1:31 PM 0.3 mL 10/08/2019 Intramuscular   Manufacturer: [place]   Lot: [id number]   [place]: [id number]

## 2020-02-07 ENCOUNTER — Ambulatory Visit (INDEPENDENT_AMBULATORY_CARE_PROVIDER_SITE_OTHER): Payer: 59 | Admitting: Gastroenterology

## 2020-02-07 ENCOUNTER — Encounter: Payer: Self-pay | Admitting: Gastroenterology

## 2020-02-07 ENCOUNTER — Other Ambulatory Visit: Payer: Self-pay

## 2020-02-07 VITALS — BP 107/66 | HR 73 | Temp 98.2°F | Ht 72.0 in | Wt 204.0 lb

## 2020-02-07 DIAGNOSIS — K580 Irritable bowel syndrome with diarrhea: Secondary | ICD-10-CM

## 2020-02-07 NOTE — Progress Notes (Signed)
Primary Care Physician: Glean Hess, MD  Primary Gastroenterologist:  Dr. [name]  Chief Complaint  Patient presents with  . Medication Refill    HPI: Paul Garner is a 64 y.o. male here for follow-up of his irritable bowel syndrome with diarrhea. The patient reports that he has been helped by both his Lomotil and fiber.  He reports that he has increased diarrhea with stress and with eating.  There is no report of any unexplained weight loss.  He states that he takes Lomotil before he goes to sleep which helps with his waking up at 4:00 in the morning to have a bowel movement.  The patient denies any black stools or bloody stools.  And he had a colonoscopy in the past with a hyperplastic polyp  Past Medical History:  Diagnosis Date  . Coronary artery disease    a. 05/2013 s/p PCI/DES to the mid/distal RCA;  b. 11/2015 MV: EF 51%, no ischemia/infarct.  . Hyperlipidemia   . PVC's (premature ventricular contractions)     Current Outpatient Medications  Medication Sig Dispense Refill  . aspirin EC 81 MG tablet Take 1 tablet (81 mg total) by mouth daily.    . cyclobenzaprine (FLEXERIL) 10 MG tablet Take 1 tablet (10 mg total) by mouth 3 (three) times daily as needed for muscle spasms. 30 tablet 0  . cycloSPORINE (RESTASIS) 0.05 % ophthalmic emulsion Place 1 drop into both eyes 2 (two) times daily.    . diphenoxylate-atropine (LOMOTIL) 2.5-0.025 MG tablet TAKE 1 TO 2 TABLETS BY MOUTH 4 TIMES A DAY AS NEEDED FOR DIARRHEA/LOOSE STOOL 90 tablet 5  . escitalopram (LEXAPRO) 10 MG tablet Take 1 tablet (10 mg total) by mouth daily. 90 tablet 1  . meloxicam (MOBIC) 15 MG tablet Take 1 tablet (15 mg total) by mouth daily. 90 tablet 1  . Methylcellulose, Laxative, (CITRUCEL PO) Take 1 tablet by mouth daily.    . naproxen sodium (ALEVE) 220 MG tablet Take 220 mg by mouth daily as needed.    . rosuvastatin (CRESTOR) 20 MG tablet Take 1 tablet (20 mg total) by mouth daily. 90 tablet 2    No current facility-administered medications for this visit.    Allergies as of 02/07/2020 - Review Complete 02/07/2020  Allergen Reaction Noted  . Vascepa [icosapent ethyl]  09/18/2018    ROS:  General: Negative for anorexia, weight loss, fever, chills, fatigue, weakness. ENT: Negative for hoarseness, difficulty swallowing , nasal congestion. CV: Negative for chest pain, angina, palpitations, dyspnea on exertion, peripheral edema.  Respiratory: Negative for dyspnea at rest, dyspnea on exertion, cough, sputum, wheezing.  GI: See history of present illness. GU:  Negative for dysuria, hematuria, urinary incontinence, urinary frequency, nocturnal urination.  Endo: Negative for unusual weight change.    Physical Examination:   BP 107/66   Pulse 73   Temp 98.2 F (36.8 C) (Temporal)   Ht 6' (1.829 m)   Wt 204 lb (92.5 kg)   BMI 27.67 kg/m   General: Well-nourished, well-developed in no acute distress.  Eyes: No icterus. Conjunctivae pink. Lungs: Clear to auscultation bilaterally. Non-labored. Heart: Regular rate and rhythm, no murmurs rubs or gallops.  Abdomen: Bowel sounds are normal, nontender, nondistended, no hepatosplenomegaly or masses, no abdominal bruits or hernia , no rebound or guarding.   Extremities: No lower extremity edema. No clubbing or deformities. Neuro: Alert and oriented x 3.  Grossly intact. Skin: Warm and dry, no jaundice.   Psych: Alert and  cooperative, normal mood and affect.  Labs:    Imaging Studies: No results found.  Assessment and Plan:   Paul Garner is a 64 y.o. 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  The patient has been told that he can supplement his medication with Imodium when necessary when his diarrhea is getting worse.  The patient has been encouraged to continue a high-fiber diet.  The patient will have his medications refilled as needed.  The patient has been explained  the plan and agrees with it.    [name], MD. [facility]    Note: This dictation was prepared with Dragon dictation along with smaller phrase technology. Any transcriptional errors that result from this process are unintentional.

## 2020-02-08 ENCOUNTER — Ambulatory Visit: Payer: 59 | Attending: Internal Medicine

## 2020-02-08 DIAGNOSIS — Z23 Encounter for immunization: Secondary | ICD-10-CM

## 2020-02-08 NOTE — Progress Notes (Signed)
   Covid-19 Vaccination Clinic  Name:  Paul Garner    MRN: [id number] DOB: January 01, 1956  02/08/2020  [name] was observed post Covid-19 immunization for 15 minutes without incident. He was provided with Vaccine Information Sheet and instruction to access the V-Safe system.   [name] was instructed to call 911 with any severe reactions post vaccine: Marland Kitchen Difficulty breathing  . Swelling of face and throat  . A fast heartbeat  . A bad rash all over body  . Dizziness and weakness   Immunizations Administered    Name Date Dose VIS Date Route   Pfizer COVID-19 Vaccine 02/08/2020  2:00 PM 0.3 mL 10/08/2019 Intramuscular   Manufacturer: [place]   Lot: [id number]   [place]: [id number]

## 2020-02-24 ENCOUNTER — Other Ambulatory Visit: Payer: Self-pay | Admitting: Gastroenterology

## 2020-02-24 NOTE — Telephone Encounter (Signed)
Lomotil 2.5 mg  [facility] (mail order)

## 2020-02-29 ENCOUNTER — Other Ambulatory Visit: Payer: Self-pay

## 2020-02-29 NOTE — Telephone Encounter (Signed)
Rx for Lomotil has been sent to [facility].

## 2020-06-19 ENCOUNTER — Other Ambulatory Visit: Payer: Self-pay | Admitting: Internal Medicine

## 2020-06-19 DIAGNOSIS — M19049 Primary osteoarthritis, unspecified hand: Secondary | ICD-10-CM

## 2020-07-12 ENCOUNTER — Other Ambulatory Visit: Payer: Self-pay | Admitting: Internal Medicine

## 2020-07-12 DIAGNOSIS — F324 Major depressive disorder, single episode, in partial remission: Secondary | ICD-10-CM

## 2020-07-12 NOTE — Telephone Encounter (Signed)
Requested medications are due for refill today? Yes  Requested medications are on active medication list?  Yes  Last Refill:  12/22/2019  # 90 with one refill  Future visit scheduled?  Yes  Notes to Clinic:  Medication failed Rx refill protocol due to no valid encounter in the past 6 months.  Last office visit was 7 months ago.

## 2020-07-17 ENCOUNTER — Telehealth: Payer: Self-pay | Admitting: Internal Medicine

## 2020-07-17 ENCOUNTER — Ambulatory Visit (INDEPENDENT_AMBULATORY_CARE_PROVIDER_SITE_OTHER): Payer: No Typology Code available for payment source

## 2020-07-17 ENCOUNTER — Other Ambulatory Visit: Payer: Self-pay

## 2020-07-17 DIAGNOSIS — Z23 Encounter for immunization: Secondary | ICD-10-CM | POA: Diagnosis not present

## 2020-07-17 NOTE — Telephone Encounter (Signed)
Copied from [place] [id number]. Topic: General - Other >> Jul 17, 2020  1:59 PM Celene Kras wrote: Reason for CRM: Pt called and is requesting to have a tetanus shot. He states that he cut his finger on a rusty nail and some dirty water. Pt is requesting to do this today. Please advise .

## 2020-07-17 NOTE — Progress Notes (Signed)
Patient was cleaning out his wifes dirty pond yesterday and cut his Right Thumb with a rusty piece of metal in the water. Came in today for a TDAP. Laceration is small and looks clean and uninfected.   CM

## 2020-09-26 ENCOUNTER — Ambulatory Visit: Payer: 59 | Admitting: Cardiovascular Disease

## 2020-09-27 ENCOUNTER — Other Ambulatory Visit: Payer: Self-pay

## 2020-09-27 ENCOUNTER — Ambulatory Visit (INDEPENDENT_AMBULATORY_CARE_PROVIDER_SITE_OTHER): Payer: No Typology Code available for payment source | Admitting: Nurse Practitioner

## 2020-09-27 ENCOUNTER — Encounter: Payer: Self-pay | Admitting: Nurse Practitioner

## 2020-09-27 VITALS — BP 128/72 | HR 71 | Ht 72.0 in | Wt 202.2 lb

## 2020-09-27 DIAGNOSIS — I2510 Atherosclerotic heart disease of native coronary artery without angina pectoris: Secondary | ICD-10-CM

## 2020-09-27 DIAGNOSIS — E785 Hyperlipidemia, unspecified: Secondary | ICD-10-CM | POA: Diagnosis not present

## 2020-09-27 DIAGNOSIS — I493 Ventricular premature depolarization: Secondary | ICD-10-CM | POA: Diagnosis not present

## 2020-09-27 NOTE — Progress Notes (Signed)
Office Visit    Patient Name: Paul Garner Date of Encounter: 09/27/2020  Primary Care Provider:  Glean Hess, MD Primary Cardiologist:  Kathlyn Sacramento, MD  Chief Complaint    64 year-old male with a history of CAD s/p PCI/DES to the mid/distal RCA in August 2014, hyperlipidemia/hypertrigliceridemia, and PVCs who presents for follow-up related to CAD.   Past Medical History    Past Medical History:  Diagnosis Date  . Coronary artery disease    a. 05/2013 s/p PCI/DES to the mid/distal RCA;  b. 11/2015 MV: EF 51%, no ischemia/infarct; c. 02/2018 ETT: Ex time 11:00. No isch/infarct.  . Hyperlipidemia   . PVC's (premature ventricular contractions)    a. 11/2015 48hr Holter: RSR, avg 77bpm. PVCs - <1%. Rare PACs, 4 beats SVT.   Past Surgical History:  Procedure Laterality Date  . CARDIAC CATHETERIZATION  06/22/2013  . COLONOSCOPY WITH PROPOFOL N/A 09/01/2018   Procedure: COLONOSCOPY WITH PROPOFOL;  Surgeon: [name], MD;  Location: [facility] ENDOSCOPY;  Service: Endoscopy;  Laterality: N/A;  . CORONARY ANGIOPLASTY WITH STENT PLACEMENT  06/22/2013   stent placement in the distal RCA & mid RCA  . INCISION AND DRAINAGE PERIRECTAL ABSCESS N/A 04/01/2016   Procedure: IRRIGATION AND DEBRIDEMENT PERIRECTAL ABSCESS;  Surgeon: Christene Lye, MD;  Location: [facility] ORS;  Service: General;  Laterality: N/A;    Allergies  Allergies  Allergen Reactions  . Vascepa [Icosapent Ethyl]     Worsening diarrhea    History of Present Illness    64 year-old male with the above past medical history including CAD s/p PCI/DES to the mid/distal RCA in August 2014, hyperlipidemia/hypertrigliceridemia, and PVCs. Cardiac history dates back to 2014 when he underwent cardiac catheterization and PCI/drug-eluting stent placement to the mid-distal RCA in the setting of stable angina. Anginal equivalent at that time was exertional right-sided neck discomfort and dyspnea. He had a negative stress test  in February 2017.   He experienced atypical chest pain in 2019 for which he underwent repeat stress testing. Exercise myoview was low risk, and showed excellent exercise capacity, EF was 55-65%. He was last seen in the clinic for follow-up November 2020 and was doing well overall with no anginal symptoms and only mild palpitations, however, the burden was not significant enough to require treatment.  Since his last visit he has done well overall. He is not exercising regularly, but remains fairly active on the weekends. He has continued intermittent palpitations, which are bothersome, but have not increased in frequency. He typically notices palpitations 30 minutes after meals, and with chocolate/caffeine consumption. Otherwise, He denies chest pain, dyspnea, pnd, orthopnea, n, v, dizziness, syncope, edema, weight gain, or early satiety.  Home Medications    Prior to Admission medications   Medication Sig Start Date End Date Taking? Authorizing Provider  aspirin EC 81 MG tablet Take 1 tablet (81 mg total) by mouth daily. 12/07/15   Deboraha Sprang, MD  cyclobenzaprine (FLEXERIL) 10 MG tablet Take 1 tablet (10 mg total) by mouth 3 (three) times daily as needed for muscle spasms. 03/08/19   Glean Hess, MD  cycloSPORINE (RESTASIS) 0.05 % ophthalmic emulsion Place 1 drop into both eyes 2 (two) times daily.    [provider]  diphenoxylate-atropine (LOMOTIL) 2.5-0.025 MG tablet TAKE 1-2 TABS BY MOUTH 4 TIMES DAILY AS NEEDED FOR DIARRHEA/LOOSE STOOLS 02/29/20   [name], MD  escitalopram (LEXAPRO) 10 MG tablet TAKE 1 TABLET DAILY 07/12/20   Glean Hess,  MD  meloxicam (MOBIC) 15 MG tablet TAKE 1 TABLET DAILY 06/19/20   Glean Hess, MD  Methylcellulose, Laxative, (CITRUCEL PO) Take 1 tablet by mouth daily.    [provider]  naproxen sodium (ALEVE) 220 MG tablet Take 220 mg by mouth daily as needed.    [provider]  rosuvastatin (CRESTOR) 20 MG tablet Take 1  tablet (20 mg total) by mouth daily. 12/21/19   Wellington Hampshire, MD    Review of Systems    Intermittent palpiations. He denies chest pain, dyspnea, pnd, orthopnea, n, v, dizziness, syncope, edema, weight gain, or early satiety. All other systems reviewed and are otherwise negative except as noted above.  Physical Exam    VS:  BP 128/72   Pulse 71   Ht 6' (1.829 m)   Wt 202 lb 3.2 oz (91.7 kg)   SpO2 98%   BMI 27.42 kg/m  , BMI Body mass index is 27.42 kg/m. GEN: Well nourished, well developed, in no acute distress. HEENT: normal. Neck: Supple, no JVD, carotid bruits, or masses. Cardiac: RRR, no murmurs, rubs, or gallops. No clubbing, cyanosis, edema.  PT 2+ and equal bilaterally.  Respiratory:  Respirations regular and unlabored, clear to auscultation bilaterally. GI: Soft, nontender, nondistended, BS + x 4. MS: no deformity or atrophy. Skin: warm and dry, no rash. Neuro:  Strength and sensation are intact. Psych: Normal affect.  Accessory Clinical Findings    ECG personally reviewed by me today - Normal sinus rhythm, 62 - no acute changes.  Lab Results  Component Value Date   WBC 6.1 12/10/2019   HGB 15.8 12/10/2019   HCT 46.5 12/10/2019   MCV 92 12/10/2019   PLT 251 12/10/2019   Lab Results  Component Value Date   CREATININE 1.01 12/10/2019   BUN 14 12/10/2019   NA 139 12/10/2019   K 4.5 12/10/2019   CL 102 12/10/2019   CO2 22 12/10/2019   Lab Results  Component Value Date   ALT 32 12/10/2019   AST 27 12/10/2019   ALKPHOS 68 12/10/2019   BILITOT 0.4 12/10/2019   Lab Results  Component Value Date   CHOL 164 12/10/2019   HDL 50 12/10/2019   LDLCALC 79 12/10/2019   TRIG 210 (H) 12/10/2019   CHOLHDL 3.3 12/10/2019    Assessment & Plan    1. CAD: Prior history of CAD s/p RCA stenting in 2014 with negative stress test in 2017 and 2019. No symptoms of angina. Continue Aspirin, statin. LDL above goal at 79, he did not tolerate Vascepa in the past due to  GI upset. Discussed lifestyle modifications including diet and exercise. He is due for repeat lipids in February with his PCP. If he remains above goal at that time, could consider increasing Crestor to 40 mg daily with the possible addition of Zetia in the future.    2. Hyperlipidemia/hypertriglyceridemia: See #1. LDL 79 in February 2021. He did not tolerate Vascepa in the past due to GI upset. Discussed lifestyle modifications with diet and exercise. He is due for repeat lipids/LFTs in February with his PCP. If he remains above goal at that time, could consider increasing Crestor to 40 mg daily with the possible addition of Zetia in the future.   3. PVCs: Continued intermittent palpitations, however, burden with prior Holter monitor not significant enough to require treatment. Stable, no change in frequency or symptoms.   4. Disposition: Repeat lipids/LFTs in February 2022 per PCP. Follow-up in 12 months  or sooner if needed.    Murray Hodgkins, NP 09/27/2020, 3:26 PM

## 2020-09-27 NOTE — Patient Instructions (Signed)
Medication Instructions:  Your physician recommends that you continue on your current medications as directed. Please refer to the Current Medication list given to you today.  *If you need a refill on your cardiac medications before your next appointment, please call your pharmacy*   Lab Work: None ordered   Testing/Procedures: None ordered   Follow-Up: At [facility], you and your health needs are our priority.  As part of our continuing mission to provide you with exceptional heart care, we have created designated Provider Care Teams.  These Care Teams include your primary Cardiologist (physician) and Advanced Practice Providers (APPs -  Physician Assistants and Nurse Practitioners) who all work together to provide you with the care you need, when you need it.  We recommend signing up for the patient portal called "MyChart".  Sign up information is provided on this After Visit Summary.  MyChart is used to connect with patients for Virtual Visits (Telemedicine).  Patients are able to view lab/test results, encounter notes, upcoming appointments, etc.  Non-urgent messages can be sent to your provider as well.   To learn more about what you can do with MyChart, go to [email or web address].    Your next appointment:   1 year(s)  The format for your next appointment:   In Person  Provider:   Kathlyn Sacramento, MD

## 2020-09-30 ENCOUNTER — Other Ambulatory Visit: Payer: Self-pay | Admitting: Cardiovascular Disease

## 2020-10-09 ENCOUNTER — Telehealth: Payer: Self-pay | Admitting: Gastroenterology

## 2020-10-09 NOTE — Telephone Encounter (Signed)
Patient wanting refill of medication lomotil sent to [facility]. Pt last seen 4.2021

## 2020-10-10 ENCOUNTER — Other Ambulatory Visit: Payer: Self-pay

## 2020-10-10 MED ORDER — DIPHENOXYLATE-ATROPINE 2.5-0.025 MG PO TABS: ORAL_TABLET | ORAL | 1 refills | Status: DC

## 2020-10-10 NOTE — Telephone Encounter (Signed)
Prescription refill for Lomotil has been sent to [facility]. Pt notified this has been done.

## 2020-11-02 ENCOUNTER — Ambulatory Visit: Payer: 59 | Admitting: Cardiovascular Disease

## 2020-12-11 ENCOUNTER — Ambulatory Visit (INDEPENDENT_AMBULATORY_CARE_PROVIDER_SITE_OTHER): Payer: No Typology Code available for payment source | Admitting: Internal Medicine

## 2020-12-11 ENCOUNTER — Other Ambulatory Visit: Payer: Self-pay

## 2020-12-11 ENCOUNTER — Encounter: Payer: Self-pay | Admitting: Internal Medicine

## 2020-12-11 VITALS — BP 138/82 | HR 64 | Temp 98.2°F | Ht 72.0 in | Wt 202.0 lb

## 2020-12-11 DIAGNOSIS — G2581 Restless legs syndrome: Secondary | ICD-10-CM | POA: Diagnosis not present

## 2020-12-11 DIAGNOSIS — I2510 Atherosclerotic heart disease of native coronary artery without angina pectoris: Secondary | ICD-10-CM

## 2020-12-11 DIAGNOSIS — D0359 Melanoma in situ of other part of trunk: Secondary | ICD-10-CM | POA: Diagnosis not present

## 2020-12-11 DIAGNOSIS — F324 Major depressive disorder, single episode, in partial remission: Secondary | ICD-10-CM

## 2020-12-11 DIAGNOSIS — E782 Mixed hyperlipidemia: Secondary | ICD-10-CM | POA: Diagnosis not present

## 2020-12-11 DIAGNOSIS — Z125 Encounter for screening for malignant neoplasm of prostate: Secondary | ICD-10-CM

## 2020-12-11 DIAGNOSIS — M19041 Primary osteoarthritis, right hand: Secondary | ICD-10-CM

## 2020-12-11 DIAGNOSIS — Z0000 Encounter for general adult medical examination without abnormal findings: Secondary | ICD-10-CM | POA: Diagnosis not present

## 2020-12-11 DIAGNOSIS — M19042 Primary osteoarthritis, left hand: Secondary | ICD-10-CM

## 2020-12-11 MED ORDER — ESCITALOPRAM OXALATE 10 MG PO TABS: 10.0000 mg | ORAL_TABLET | Freq: Every day | ORAL | 1 refills | Status: DC

## 2020-12-11 MED ORDER — CELECOXIB 200 MG PO CAPS: 200.0000 mg | ORAL_CAPSULE | Freq: Every day | ORAL | 0 refills | Status: DC

## 2020-12-11 MED ORDER — ROPINIROLE HCL 0.5 MG PO TABS: 0.5000 mg | ORAL_TABLET | Freq: Every evening | ORAL | 0 refills | Status: DC

## 2020-12-11 NOTE — Patient Instructions (Signed)
Take Requip around 5 PM.  The dose can be increased if needed - call after trying this dose for several weeks.

## 2020-12-11 NOTE — Progress Notes (Addendum)
Date:  12/11/2020   Name:  Paul Garner   DOB:  [birth year]   MRN:  [id number]   Chief Complaint: Annual Exam  Paul Garner is a 65 y.o. male who presents today for his Complete Annual Exam. He feels well. He reports exercising nothing regular. He reports he is sleeping fairly well.   Colonoscopy: 08/2018  Immunization History  Administered Date(s) Administered  . Influenza Inj Mdck Quad With Preservative 08/26/2018  . Influenza, Quadrivalent, Recombinant, Inj, Pf 08/25/2019  . Influenza,inj,Quad PF,6+ Mos 09/04/2016  . Influenza-Unspecified 08/25/2019  . PFIZER(Purple Top)SARS-COV-2 Vaccination 01/14/2020, 02/08/2020  . Tdap 07/17/2020  . Zoster Recombinat (Shingrix) 12/07/2018, 03/08/2019   CAD - followed by cardiology.  On statin therapy.  No recent symptoms. Hyperlipidemia The problem is controlled. Associated symptoms include myalgias (restless legs in the evening). Pertinent negatives include no chest pain, focal weakness or shortness of breath. Current antihyperlipidemic treatment includes statins. The current treatment provides significant improvement of lipids. There are no compliance problems.   Depression        This is a chronic problem.  The problem has been resolved since onset.  Associated symptoms include myalgias (restless legs in the evening).  Associated symptoms include no fatigue, no appetite change and no headaches.  Past treatments include SSRIs - Selective serotonin reuptake inhibitors.  Compliance with treatment is good. RLS - starts around 6 pm until 9 pm almost every evening.  It does not interfere with sleep. OA hands - using mobic but not working as well.  Would like to try Celebrex.  Lab Results  Component Value Date   CREATININE 1.01 12/10/2019   BUN 14 12/10/2019   NA 139 12/10/2019   K 4.5 12/10/2019   CL 102 12/10/2019   CO2 22 12/10/2019   Lab Results  Component Value Date   CHOL 164 12/10/2019   HDL 50 12/10/2019   LDLCALC 79  12/10/2019   TRIG 210 (H) 12/10/2019   CHOLHDL 3.3 12/10/2019   Lab Results  Component Value Date   TSH 2.280 12/10/2019   No results found for: HGBA1C Lab Results  Component Value Date   WBC 6.1 12/10/2019   HGB 15.8 12/10/2019   HCT 46.5 12/10/2019   MCV 92 12/10/2019   PLT 251 12/10/2019   Lab Results  Component Value Date   ALT 32 12/10/2019   AST 27 12/10/2019   ALKPHOS 68 12/10/2019   BILITOT 0.4 12/10/2019     Review of Systems  Constitutional: Negative for appetite change, chills, diaphoresis, fatigue and unexpected weight change.  HENT: Negative for hearing loss and trouble swallowing.   Eyes: Negative for visual disturbance.  Respiratory: Negative for choking, shortness of breath and wheezing.   Cardiovascular: Negative for chest pain, palpitations and leg swelling.  Gastrointestinal: Positive for diarrhea (chronic, uses lomotil). Negative for abdominal pain, blood in stool and constipation.  Genitourinary: Negative for difficulty urinating, dysuria, frequency and urgency.  Musculoskeletal: Positive for myalgias (restless legs in the evening). Negative for arthralgias (hands and back) and back pain.  Skin: Negative for color change and rash.  Neurological: Negative for dizziness, focal weakness, syncope and headaches.  Hematological: Negative for adenopathy.  Psychiatric/Behavioral: Positive for depression. Negative for dysphoric mood and sleep disturbance. The patient is not nervous/anxious.     Patient Active Problem List   Diagnosis Date Noted  . Melanoma in situ of back ([place]) 12/10/2019  . Mixed hyperlipidemia 12/07/2018  . IBS (irritable bowel syndrome) 12/07/2018  .  Ganglion cyst of dorsum of right wrist 12/07/2018  . Hyperplastic polyp of large intestine   . Major depressive disorder with single episode, in partial remission ([place]) 11/29/2016  . Basal cell carcinoma, arm 07/24/2016  . Gout 07/24/2016  . Arthritis of both hands 07/24/2016  . PVC's  (premature ventricular contractions)   . Coronary atherosclerosis of native coronary artery 06/22/2013    Allergies  Allergen Reactions  . Vascepa [Icosapent Ethyl]     Worsening diarrhea    Past Surgical History:  Procedure Laterality Date  . CARDIAC CATHETERIZATION  06/22/2013  . COLONOSCOPY WITH PROPOFOL N/A 09/01/2018   Procedure: COLONOSCOPY WITH PROPOFOL;  Surgeon: [name], MD;  Location: [facility] ENDOSCOPY;  Service: Endoscopy;  Laterality: N/A;  . CORONARY ANGIOPLASTY WITH STENT PLACEMENT  06/22/2013   stent placement in the distal RCA & mid RCA  . INCISION AND DRAINAGE PERIRECTAL ABSCESS N/A 04/01/2016   Procedure: IRRIGATION AND DEBRIDEMENT PERIRECTAL ABSCESS;  Surgeon: Christene Lye, MD;  Location: [facility] ORS;  Service: General;  Laterality: N/A;    Social History   Tobacco Use  . Smoking status: Never Smoker  . Smokeless tobacco: Never Used  Substance Use Topics  . Alcohol use: Yes    Comment: 5-10 Beers or Glasses of Wine Weekly  . Drug use: No     Medication list has been reviewed and updated.  Current Meds  Medication Sig  . aspirin EC 81 MG tablet Take 1 tablet (81 mg total) by mouth daily.  . cycloSPORINE (RESTASIS) 0.05 % ophthalmic emulsion Place 1 drop into both eyes 2 (two) times daily.  . diphenoxylate-atropine (LOMOTIL) 2.5-0.025 MG tablet TAKE 1-2 TABS BY MOUTH 4 TIMES DAILY AS NEEDED FOR DIARRHEA/LOOSE STOOLS  . escitalopram (LEXAPRO) 10 MG tablet TAKE 1 TABLET DAILY  . meloxicam (MOBIC) 15 MG tablet TAKE 1 TABLET DAILY  . Methylcellulose, Laxative, (CITRUCEL PO) Take 1 tablet by mouth daily.  . naproxen sodium (ALEVE) 220 MG tablet Take 220 mg by mouth daily as needed.  . rosuvastatin (CRESTOR) 20 MG tablet TAKE 1 TABLET DAILY    PHQ 2/9 Scores 12/11/2020 12/10/2019 05/26/2019 05/26/2019  PHQ - 2 Score 0 0 0 0  PHQ- 9 Score 0 0 0 -    GAD 7 : Generalized Anxiety Score 12/11/2020 07/24/2016  Nervous, Anxious, on Edge 0 1  Control/stop  worrying 0 3  Worry too much - different things 0 3  Trouble relaxing 0 1  Restless 0 0  Easily annoyed or irritable 0 1  Afraid - awful might happen 0 1  Total GAD 7 Score 0 10  Anxiety Difficulty Not difficult at all Very difficult    BP Readings from Last 3 Encounters:  12/11/20 138/82  09/27/20 128/72  02/07/20 107/66    Physical Exam Vitals and nursing note reviewed.  Constitutional:      Appearance: Normal appearance. He is well-developed.  HENT:     Head: Normocephalic.     Right Ear: Tympanic membrane, ear canal and external ear normal.     Left Ear: Tympanic membrane, ear canal and external ear normal.     Nose: Nose normal.  Eyes:     Conjunctiva/sclera: Conjunctivae normal.     Pupils: Pupils are equal, round, and reactive to light.  Neck:     Thyroid: No thyromegaly.     Vascular: No carotid bruit.  Cardiovascular:     Rate and Rhythm: Normal rate and regular rhythm.     Heart sounds:  Normal heart sounds.  Pulmonary:     Effort: Pulmonary effort is normal.     Breath sounds: Normal breath sounds. No wheezing.  Chest:  Breasts:     Right: No mass.     Left: No mass.    Abdominal:     General: Bowel sounds are normal.     Palpations: Abdomen is soft.     Tenderness: There is no abdominal tenderness.  Musculoskeletal:        General: Normal range of motion.     Cervical back: Normal range of motion and neck supple.  Lymphadenopathy:     Cervical: No cervical adenopathy.  Skin:    General: Skin is warm and dry.  Neurological:     General: No focal deficit present.     Mental Status: He is alert and oriented to person, place, and time.     Deep Tendon Reflexes: Reflexes are normal and symmetric.  Psychiatric:        Attention and Perception: Attention normal.        Mood and Affect: Mood normal.        Behavior: Behavior normal.     Wt Readings from Last 3 Encounters:  12/11/20 228 lb (103.4 kg)  09/27/20 202 lb 3.2 oz (91.7 kg)  02/07/20 204  lb (92.5 kg)    BP 138/82   Pulse 64   Temp 98.2 F (36.8 C) (Oral)   Ht 6' (1.829 m)   Wt 228 lb (103.4 kg)   SpO2 98%   BMI 30.92 kg/m   Assessment and Plan: 1. Annual physical exam Exam is normal except for weight. Encourage regular exercise and appropriate dietary changes.  2. Atherosclerosis of native coronary artery of native heart without angina pectoris Seen by Cardiology - stable  On crestor but may need to increase the dose - CBC with Differential/Platelet  3. Mixed hyperlipidemia Tolerating statin medication without side effects at this time LDL is not at goal of < 70 on current dose Continue same therapy without change at this time. - Comprehensive metabolic panel - Lipid panel  4. Major depressive disorder with single episode, in partial remission (HCC) Clinically stable on current regimen with good control of symptoms, No SI or HI. Will continue current therapy. - TSH - escitalopram (LEXAPRO) 10 MG tablet; Take 1 tablet (10 mg total) by mouth daily.  Dispense: 90 tablet; Refill: 1  5. Prostate cancer screening Nocturia x 1-2 DRE deferred - PSA  6. Restless leg syndrome Recommend requip every evening - can increase the dose if needed - rOPINIRole (REQUIP) 0.5 MG tablet; Take 1 tablet (0.5 mg total) by mouth every evening.  Dispense: 90 tablet; Refill: 0  7. Arthritis of both hands D/c mobic Begin Celebrex once daily - celecoxib (CELEBREX) 200 MG capsule; Take 1 capsule (200 mg total) by mouth daily.  Dispense: 90 capsule; Refill: 0  8. Melanoma in situ of back [facility]) Followed by Dermatology   Partially dictated using Dragon software. Any errors are unintentional.  Halina Maidens, MD [facility]  12/11/2020

## 2020-12-12 LAB — CBC WITH DIFFERENTIAL/PLATELET
Basophils Absolute: 0.1 10*3/uL (ref 0.0–0.2)
Basos: 1 %
EOS (ABSOLUTE): 0.2 10*3/uL (ref 0.0–0.4)
Eos: 3 %
Hematocrit: 45 % (ref 37.5–51.0)
Hemoglobin: 15.2 g/dL (ref 13.0–17.7)
Immature Grans (Abs): 0.1 10*3/uL (ref 0.0–0.1)
Immature Granulocytes: 2 %
Lymphocytes Absolute: 1.7 10*3/uL (ref 0.7–3.1)
Lymphs: 28 %
MCH: 32.3 pg (ref 26.6–33.0)
MCHC: 33.8 g/dL (ref 31.5–35.7)
MCV: 96 fL (ref 79–97)
Monocytes Absolute: 0.5 10*3/uL (ref 0.1–0.9)
Monocytes: 7 %
Neutrophils Absolute: 3.6 10*3/uL (ref 1.4–7.0)
Neutrophils: 59 %
Platelets: 232 10*3/uL (ref 150–450)
RBC: 4.71 x10E6/uL (ref 4.14–5.80)
RDW: 12.9 % (ref 11.6–15.4)
WBC: 6.2 10*3/uL (ref 3.4–10.8)

## 2020-12-12 LAB — COMPREHENSIVE METABOLIC PANEL
ALT: 25 IU/L (ref 0–44)
AST: 30 IU/L (ref 0–40)
Albumin/Globulin Ratio: 1.8 (ref 1.2–2.2)
Albumin: 4.5 g/dL (ref 3.8–4.8)
Alkaline Phosphatase: 76 IU/L (ref 44–121)
BUN/Creatinine Ratio: 15 (ref 10–24)
BUN: 16 mg/dL (ref 8–27)
Bilirubin Total: 0.3 mg/dL (ref 0.0–1.2)
CO2: 16 mmol/L — ABNORMAL LOW (ref 20–29)
Calcium: 9.3 mg/dL (ref 8.6–10.2)
Chloride: 102 mmol/L (ref 96–106)
Creatinine, Ser: 1.04 mg/dL (ref 0.76–1.27)
GFR calc Af Amer: 89 mL/min/{1.73_m2} (ref >59)
GFR calc non Af Amer: 77 mL/min/{1.73_m2} (ref >59)
Globulin, Total: 2.5 g/dL (ref 1.5–4.5)
Glucose: 99 mg/dL (ref 65–99)
Potassium: 4.4 mmol/L (ref 3.5–5.2)
Sodium: 140 mmol/L (ref 134–144)
Total Protein: 7 g/dL (ref 6.0–8.5)

## 2020-12-12 LAB — TSH: TSH: 2.25 u[IU]/mL (ref 0.450–4.500)

## 2020-12-12 LAB — LIPID PANEL
Chol/HDL Ratio: 3.2 ratio (ref 0.0–5.0)
Cholesterol, Total: 159 mg/dL (ref 100–199)
HDL: 50 mg/dL (ref >39)
LDL Chol Calc (NIH): 69 mg/dL (ref 0–99)
Triglycerides: 249 mg/dL — ABNORMAL HIGH (ref 0–149)
VLDL Cholesterol Cal: 40 mg/dL (ref 5–40)

## 2020-12-12 LAB — PSA: Prostate Specific Ag, Serum: 0.6 ng/mL (ref 0.0–4.0)

## 2021-03-01 ENCOUNTER — Other Ambulatory Visit: Payer: Self-pay | Admitting: Internal Medicine

## 2021-03-01 DIAGNOSIS — M19041 Primary osteoarthritis, right hand: Secondary | ICD-10-CM

## 2021-05-21 ENCOUNTER — Telehealth: Payer: Self-pay

## 2021-05-21 NOTE — Progress Notes (Signed)
Formatting of this note might be different from the original.    Self Swab Type: Anterior Nasal    Time of patient swab  Electronically signed by Vida Rigger at 05/21/2021  7:50 PM EDT

## 2021-05-21 NOTE — Telephone Encounter (Unsigned)
Copied from [place] [id number]. Topic: General - Other >> May 21, 2021  1:56 PM Celene Kras wrote: Reason for CRM: Pt called and is requesting to have a covid test done in office due symptoms he is experiencing. He states that he has had fever, chills, diarrhea, fatigue, and cough. Please advise.

## 2021-05-21 NOTE — Telephone Encounter (Signed)
Called pt told him her could get a test at his local pharmacy [facility]. Pt verbalized understanding. Pt stated he took a at home test which is negative at this time but wants a PCR test.  KP

## 2021-06-12 ENCOUNTER — Other Ambulatory Visit: Payer: Self-pay | Admitting: Internal Medicine

## 2021-06-12 DIAGNOSIS — M19042 Primary osteoarthritis, left hand: Secondary | ICD-10-CM

## 2021-06-12 NOTE — Telephone Encounter (Signed)
Requested Prescriptions  Pending Prescriptions Disp Refills  . celecoxib (CELEBREX) 200 MG capsule [Pharmacy Med Name: CELECOXIB CAP '200MG'$ ] 90 capsule 1    Sig: TAKE 1 CAPSULE DAILY     Analgesics:  COX2 Inhibitors Passed - 06/12/2021  5:20 PM      Passed - HGB in normal range and within 360 days    Hemoglobin  Date Value Ref Range Status  12/11/2020 15.2 13.0 - 17.7 g/dL Final         Passed - Cr in normal range and within 360 days    Creatinine, Ser  Date Value Ref Range Status  12/11/2020 1.04 0.76 - 1.27 mg/dL Final         Passed - Patient is not pregnant      Passed - Valid encounter within last 12 months    Recent Outpatient Visits          6 months ago Annual physical exam   [facility] Glean Hess, MD   1 year ago Annual physical exam   [facility] Glean Hess, MD   2 years ago Arthritis of hand   [facility] Glean Hess, MD   2 years ago Need for shingles vaccine   [facility] Glean Hess, MD   2 years ago Annual physical exam   [facility] Glean Hess, MD      Future Appointments            In 6 months Army Melia Jesse Sans, MD [facility], [facility]

## 2021-06-30 ENCOUNTER — Other Ambulatory Visit: Payer: Self-pay | Admitting: Internal Medicine

## 2021-06-30 DIAGNOSIS — F324 Major depressive disorder, single episode, in partial remission: Secondary | ICD-10-CM

## 2021-09-03 ENCOUNTER — Other Ambulatory Visit: Payer: Self-pay | Admitting: Cardiovascular Disease

## 2021-09-03 ENCOUNTER — Other Ambulatory Visit: Payer: Self-pay | Admitting: Gastroenterology

## 2021-09-18 ENCOUNTER — Telehealth: Payer: Self-pay | Admitting: Gastroenterology

## 2021-09-18 NOTE — Telephone Encounter (Signed)
Inbound call from pt requesting a med refill for Lomotil. Please advise. Thank you.

## 2021-09-19 ENCOUNTER — Other Ambulatory Visit: Payer: Self-pay

## 2021-09-19 MED ORDER — DIPHENOXYLATE-ATROPINE 2.5-0.025 MG PO TABS: ORAL_TABLET | ORAL | 1 refills | Status: AC

## 2021-09-19 NOTE — Telephone Encounter (Signed)
[facility] [place]

## 2021-09-28 ENCOUNTER — Other Ambulatory Visit: Payer: Self-pay

## 2021-09-28 ENCOUNTER — Ambulatory Visit (INDEPENDENT_AMBULATORY_CARE_PROVIDER_SITE_OTHER): Payer: No Typology Code available for payment source

## 2021-09-28 DIAGNOSIS — Z23 Encounter for immunization: Secondary | ICD-10-CM | POA: Diagnosis not present

## 2021-11-23 ENCOUNTER — Other Ambulatory Visit: Payer: Self-pay | Admitting: Cardiovascular Disease

## 2021-11-23 ENCOUNTER — Other Ambulatory Visit: Payer: Self-pay | Admitting: Internal Medicine

## 2021-11-23 DIAGNOSIS — M19041 Primary osteoarthritis, right hand: Secondary | ICD-10-CM

## 2021-11-23 NOTE — Telephone Encounter (Signed)
Requested Prescriptions  Pending Prescriptions Disp Refills   celecoxib (CELEBREX) 200 MG capsule [Pharmacy Med Name: CELECOXIB CAP 200MG ] 90 capsule 1    Sig: TAKE 1 CAPSULE DAILY     Analgesics:  COX2 Inhibitors Passed - 11/23/2021  2:15 AM      Passed - HGB in normal range and within 360 days    Hemoglobin  Date Value Ref Range Status  12/11/2020 15.2 13.0 - 17.7 g/dL Final         Passed - Cr in normal range and within 360 days    Creatinine, Ser  Date Value Ref Range Status  12/11/2020 1.04 0.76 - 1.27 mg/dL Final         Passed - Patient is not pregnant      Passed - Valid encounter within last 12 months    Recent Outpatient Visits          11 months ago Annual physical exam   [facility] Glean Hess, MD   1 year ago Annual physical exam   [facility] Glean Hess, MD   2 years ago Arthritis of hand   [facility] Glean Hess, MD   2 years ago Need for shingles vaccine   [facility] Glean Hess, MD   2 years ago Annual physical exam   [facility] Glean Hess, MD      Future Appointments            In 3 weeks Army Melia Jesse Sans, MD [facility], [facility]

## 2021-11-23 NOTE — Telephone Encounter (Signed)
Please schedule overdue F/U appointment. Thank you! ?

## 2021-11-23 NOTE — Telephone Encounter (Signed)
Scheduled

## 2021-11-27 ENCOUNTER — Encounter: Payer: Self-pay | Admitting: Medical

## 2021-11-27 ENCOUNTER — Other Ambulatory Visit: Payer: Self-pay

## 2021-11-27 ENCOUNTER — Telehealth: Payer: Self-pay | Admitting: Medical

## 2021-11-27 ENCOUNTER — Other Ambulatory Visit: Payer: Self-pay | Admitting: Emergency Medicine

## 2021-11-27 ENCOUNTER — Ambulatory Visit: Payer: No Typology Code available for payment source | Admitting: Medical

## 2021-11-27 VITALS — BP 120/80 | HR 63 | Ht 72.0 in | Wt 206.0 lb

## 2021-11-27 DIAGNOSIS — E782 Mixed hyperlipidemia: Secondary | ICD-10-CM

## 2021-11-27 DIAGNOSIS — E785 Hyperlipidemia, unspecified: Secondary | ICD-10-CM

## 2021-11-27 DIAGNOSIS — I493 Ventricular premature depolarization: Secondary | ICD-10-CM

## 2021-11-27 DIAGNOSIS — I2510 Atherosclerotic heart disease of native coronary artery without angina pectoris: Secondary | ICD-10-CM

## 2021-11-27 MED ORDER — FENOFIBRATE 145 MG PO TABS: 145.0000 mg | ORAL_TABLET | Freq: Every day | ORAL | 3 refills | Status: AC

## 2021-11-27 MED ORDER — ROSUVASTATIN CALCIUM 20 MG PO TABS: 20.0000 mg | ORAL_TABLET | Freq: Every day | ORAL | 3 refills | Status: DC

## 2021-11-27 MED ORDER — FENOFIBRATE 145 MG PO TABS: 145.0000 mg | ORAL_TABLET | Freq: Every day | ORAL | 3 refills | Status: DC

## 2021-11-27 NOTE — Telephone Encounter (Signed)
Paul Garner B 37 minutes ago (4:30 PM)    Had visit today [facility]       [facility]   [facility]

## 2021-11-27 NOTE — Patient Instructions (Signed)
Medication Instructions:  Your physician has recommended you make the following change in your medication:   START taking fenofibrate (Tricor) 145 mg daily    *If you need a refill on your cardiac medications before your next appointment, please call your pharmacy*   Lab Work: None ordered  If you have labs (blood work) drawn today and your tests are completely normal, you will receive your results only by: [place] (if you have MyChart) OR A paper copy in the mail If you have any lab test that is abnormal or we need to change your treatment, we will call you to review the results.   Testing/Procedures: None ordered   Follow-Up: At [facility], you and your health needs are our priority.  As part of our continuing mission to provide you with exceptional heart care, we have created designated Provider Care Teams.  These Care Teams include your primary Cardiologist (physician) and Advanced Practice Providers (APPs -  Physician Assistants and Nurse Practitioners) who all work together to provide you with the care you need, when you need it.  We recommend signing up for the patient portal called "MyChart".  Sign up information is provided on this After Visit Summary.  MyChart is used to connect with patients for Virtual Visits (Telemedicine).  Patients are able to view lab/test results, encounter notes, upcoming appointments, etc.  Non-urgent messages can be sent to your provider as well.   To learn more about what you can do with MyChart, go to [email or web address].    Your next appointment:    Your physician wants you to follow-up in: 1 year  You will receive a reminder letter in the mail two months in advance. If you don't receive a letter, please call our office to schedule the follow-up appointment.   The format for your next appointment:   In Person  Provider:   You may see Kathlyn Sacramento, MD or one of the following Advanced Practice Providers on your designated  Care Team:   Murray Hodgkins, NP Christell Faith, PA-C Cadence Kathlen Mody, PA-C1}    Other Instructions N/A

## 2021-11-27 NOTE — Progress Notes (Signed)
Cardiology Office Note:    Date:  11/27/2021   ID:  Paul Garner, DOB [birth year], MRN [id number]  PCP:  Glean Hess, MD  [facility] Cardiologist:  Kathlyn Sacramento, MD  [facility] Electrophysiologist:  None   Referring MD: Glean Hess, MD   Chief Complaint: 12 month follow-up  History of Present Illness:    Paul Garner is a 66 y.o. male with a hx of with a history of CAD s/p PCI/DES to the mid/distal RCA in August 2014, hyperlipidemia/hypertrigliceridemia, and PVCs who presents for follow-up.   H/o CAD s/p PCI/DES to the mid/distal RCA in August 2014, hyperlipidemia/hypertrigliceridemia, and PVCs. Cardiac history dates back to 2014 when he underwent cardiac catheterization and PCI/drug-eluting stent placement to the mid-distal RCA in the setting of stable angina. Anginal equivalent at that time was exertional right-sided neck discomfort and dyspnea. He had a negative stress test in February 2017.    He experienced atypical chest pain in 2019 for which he underwent repeat stress testing. Exercise myoview was low risk, and showed excellent exercise capacity, EF was 55-65%. He was last seen in the clinic for follow-up November 2020 and was doing well overall with no anginal symptoms and only mild palpitations, however, the burden was not significant enough to require treatment.  Last seen 09/2020 and was overall doing well, had intermittent palpitations. However prior holter monitor did not shows significant enough burden of PVCs to treat.   Today, the patient reports doing well. Has PVCs, worse after lunch. Come in bursts, not every day. Not on any medication for them. Not worse than before. No chest pain or SOB. No LLE, orthopnea, pnd. Patient walks a couple days a week, works on the weekends. Diet is OK, trying to watch food intake. PCP will get general labs in a couple weeks.   Past Medical History:  Diagnosis Date   Coronary artery disease    a. 05/2013 s/p  PCI/DES to the mid/distal RCA;  b. 11/2015 MV: EF 51%, no ischemia/infarct; c. 02/2018 ETT: Ex time 11:00. No isch/infarct.   Hyperlipidemia    PVC's (premature ventricular contractions)    a. 11/2015 48hr Holter: RSR, avg 77bpm. PVCs - <1%. Rare PACs, 4 beats SVT.    Past Surgical History:  Procedure Laterality Date   CARDIAC CATHETERIZATION  06/22/2013   COLONOSCOPY WITH PROPOFOL N/A 09/01/2018   Procedure: COLONOSCOPY WITH PROPOFOL;  Surgeon: [name], MD;  Location: [facility] ENDOSCOPY;  Service: Endoscopy;  Laterality: N/A;   CORONARY ANGIOPLASTY WITH STENT PLACEMENT  06/22/2013   stent placement in the distal RCA & mid RCA   INCISION AND DRAINAGE PERIRECTAL ABSCESS N/A 04/01/2016   Procedure: [place];  Surgeon: Christene Lye, MD;  Location: [facility] ORS;  Service: General;  Laterality: N/A;    Current Medications: Current Meds  Medication Sig   aspirin EC 81 MG tablet Take 1 tablet (81 mg total) by mouth daily.   celecoxib (CELEBREX) 200 MG capsule TAKE 1 CAPSULE DAILY   cycloSPORINE (RESTASIS) 0.05 % ophthalmic emulsion Place 1 drop into both eyes 2 (two) times daily.   diphenoxylate-atropine (LOMOTIL) 2.5-0.025 MG tablet TAKE 1-2 TABS BY MOUTH 4 TIMES DAILY AS NEEDED FOR DIARRHEA/LOOSE STOOLS   escitalopram (LEXAPRO) 10 MG tablet TAKE 1 TABLET DAILY   fenofibrate (TRICOR) 145 MG tablet Take 1 tablet (145 mg total) by mouth daily.   Methylcellulose, Laxative, (CITRUCEL PO) Take 1 tablet by mouth daily.   [DISCONTINUED] rosuvastatin (CRESTOR)  20 MG tablet Take 1 tablet (20 mg total) by mouth daily. PLEASE SCHEDULE OFFICE VISIT FOR FURTHER REFILLS. THANK YOU!     Allergies:   Vascepa [icosapent ethyl]   Social History   Socioeconomic History   Marital status: Married    Spouse name: Not on file   Number of children: Not on file   Years of education: Not on file   Highest education level: Not on file  Occupational History   Not on file   Tobacco Use   Smoking status: Never   Smokeless tobacco: Never  Substance and Sexual Activity   Alcohol use: Yes    Comment: 5-10 Beers or Glasses of Wine Weekly   Drug use: No   Sexual activity: Yes    Partners: Female  Other Topics Concern   Not on file  Social History Narrative   Not on file   Social Determinants of Health   Financial Resource Strain: Not on file  Food Insecurity: Not on file  Transportation Needs: Not on file  Physical Activity: Not on file  Stress: Not on file  Social Connections: Not on file     Family History: The patient's family history includes Cancer (age of onset: 95) in his mother; Heart attack (age of onset: 6) in his father; Heart disease in his father; Hyperlipidemia in his father; Rheum arthritis in his paternal grandmother.  ROS:   Please see the history of present illness.     All other systems reviewed and are negative.  EKGs/Labs/Other Studies Reviewed:    The following studies were reviewed today:  MPI 2019 Narrative & Impression  Blood pressure demonstrated a normal response to exercise. Excellent exercise capacity. There was no ST segment deviation noted during stress. No T wave inversion was noted during stress. The study is normal. This is a low risk study. The left ventricular ejection fraction is normal (55-65%).    Holter monitor 11/2015 48 hour Holter monitor Normal sinus rhythm,  average heart rate 77 bpm PVCs noted, rare couplet, Less than 1% of total beats Rare APC noted, one run of 4 beats   Signed, Esmond Plants, MD, Ph.D [facility]  EKG:  EKG is  ordered today.  The ekg ordered today demonstrates Nsr, 63bpm, no ST/T wave changes  Recent Labs: 12/11/2020: ALT 25; BUN 16; Creatinine, Ser 1.04; Hemoglobin 15.2; Platelets 232; Potassium 4.4; Sodium 140; TSH 2.250  Recent Lipid Panel    Component Value Date/Time   CHOL 159 12/11/2020 0833   TRIG 249 (H) 12/11/2020 0833   HDL 50 12/11/2020 0833    CHOLHDL 3.2 12/11/2020 0833   CHOLHDL 3.3 12/12/2015 1007   VLDL 29 12/12/2015 1007   LDLCALC 69 12/11/2020 0833     Physical Exam:    VS:  BP 120/80 (BP Location: Left Arm, Patient Position: Sitting, Cuff Size: Normal)    Pulse 63    Ht 6' (1.829 m)    Wt 206 lb (93.4 kg)    SpO2 96%    BMI 27.94 kg/m     Wt Readings from Last 3 Encounters:  11/27/21 206 lb (93.4 kg)  12/11/20 202 lb (91.6 kg)  09/27/20 202 lb 3.2 oz (91.7 kg)     GEN:  Well nourished, well developed in no acute distress HEENT: Normal NECK: No JVD; No carotid bruits LYMPHATICS: No lymphadenopathy CARDIAC: RRR, no murmurs, rubs, gallops RESPIRATORY:  Clear to auscultation without rales, wheezing or rhonchi  ABDOMEN: Soft, non-tender, non-distended MUSCULOSKELETAL:  No  edema; No deformity  SKIN: Warm and dry NEUROLOGIC:  Alert and oriented x 3 PSYCHIATRIC:  Normal affect   ASSESSMENT:    1. Coronary artery disease involving native coronary artery of native heart without angina pectoris   2. PVC's (premature ventricular contractions)   3. Hyperlipidemia LDL goal <70   4. Hyperlipidemia, mixed    PLAN:    In order of problems listed above:  CAD s/p RCA stenting in 2014 Patient denies anginal symptoms. Last ischemic evaluation was in 2019 with MPI showing LVEF 55-65%, normal, low risk study. No further ischemic work-up indicated at this time. Continue Aspirin and statin.   HLD LDL 69 on last re-check, and TG 240s. Continue Crestor, we will refill this. We will add fenofibrate 150mg  daily. Did not tolerate vascepa due due to GI upset. PCP will re-check annual labs.   PVCs/palpitations Prior holter monitor did not shows significant enough burden of PVCs to treat. Reports unchanged palpitations/PVCs.  If symptoms worsen can consider adding BB.    Disposition: Follow up in 1 year(s) with MD/APP    Signed, Eleaner Dibartolo Ninfa Meeker, PA-C  11/27/2021 3:45 PM    [facility]

## 2021-11-27 NOTE — Telephone Encounter (Signed)
Had visit today [facility] resub[facility]

## 2021-12-17 ENCOUNTER — Other Ambulatory Visit: Payer: Self-pay

## 2021-12-17 ENCOUNTER — Encounter: Payer: Self-pay | Admitting: Internal Medicine

## 2021-12-17 ENCOUNTER — Ambulatory Visit (INDEPENDENT_AMBULATORY_CARE_PROVIDER_SITE_OTHER): Payer: No Typology Code available for payment source | Admitting: Internal Medicine

## 2021-12-17 VITALS — BP 118/84 | HR 72 | Ht 72.0 in | Wt 205.0 lb

## 2021-12-17 DIAGNOSIS — Z125 Encounter for screening for malignant neoplasm of prostate: Secondary | ICD-10-CM | POA: Diagnosis not present

## 2021-12-17 DIAGNOSIS — G2581 Restless legs syndrome: Secondary | ICD-10-CM | POA: Diagnosis not present

## 2021-12-17 DIAGNOSIS — Z0000 Encounter for general adult medical examination without abnormal findings: Secondary | ICD-10-CM | POA: Diagnosis not present

## 2021-12-17 DIAGNOSIS — E782 Mixed hyperlipidemia: Secondary | ICD-10-CM

## 2021-12-17 DIAGNOSIS — F324 Major depressive disorder, single episode, in partial remission: Secondary | ICD-10-CM | POA: Diagnosis not present

## 2021-12-17 MED ORDER — ESCITALOPRAM OXALATE 10 MG PO TABS: 10.0000 mg | ORAL_TABLET | Freq: Every day | ORAL | 3 refills | Status: DC

## 2021-12-17 NOTE — Progress Notes (Signed)
Date:  12/17/2021   Name:  Paul Garner   DOB:  [birth year]   MRN:  [id number]   Chief Complaint: Annual Exam Paul Garner is a 66 y.o. male who presents today for his Complete Annual Exam. He feels well. He reports exercising house work and cardio. He reports he is sleeping well. He tore his left plantar fascia and is in a boot.  Colonoscopy: 08/2018 repeat 10 yrs  Immunization History  Administered Date(s) Administered   Influenza Inj Mdck Quad With Preservative 08/26/2018   Influenza, Quadrivalent, Recombinant, Inj, Pf 08/25/2019   Influenza,inj,Quad PF,6+ Mos 09/04/2016, 09/28/2021   Influenza-Unspecified 08/25/2019   PFIZER(Purple Top)SARS-COV-2 Vaccination 01/14/2020, 02/08/2020   Tdap 07/17/2020   Zoster Recombinat (Shingrix) 12/07/2018, 03/08/2019    Lab Results  Component Value Date   PSA1 0.6 12/11/2020   PSA1 0.8 12/10/2019   PSA1 0.6 12/07/2018    Hyperlipidemia This is a chronic problem. The problem is controlled. Pertinent negatives include no chest pain, myalgias or shortness of breath. Current antihyperlipidemic treatment includes statins and fibric acid derivatives. The current treatment provides significant improvement of lipids.  Depression        This is a chronic problem.The problem is unchanged.  Associated symptoms include no fatigue, no appetite change, no myalgias and no headaches.  Past treatments include SSRIs - Selective serotonin reuptake inhibitors.  Compliance with treatment is good.  Previous treatment provided significant relief. Gastroesophageal Reflux He complains of heartburn and tooth decay. He reports no abdominal pain, no chest pain, no choking or no wheezing. This is a new problem. The problem occurs frequently. Pertinent negatives include no fatigue. He has tried a PPI (recommended by Dentist) for the symptoms. The treatment provided moderate relief.  RLS - tried Requip 0.5 mg but did not have much benefit.  He stopped using it  and his RLS are stable.   Lab Results  Component Value Date   NA 140 12/11/2020   K 4.4 12/11/2020   CO2 16 (L) 12/11/2020   GLUCOSE 99 12/11/2020   BUN 16 12/11/2020   CREATININE 1.04 12/11/2020   CALCIUM 9.3 12/11/2020   GFRNONAA 77 12/11/2020   Lab Results  Component Value Date   CHOL 159 12/11/2020   HDL 50 12/11/2020   LDLCALC 69 12/11/2020   TRIG 249 (H) 12/11/2020   CHOLHDL 3.2 12/11/2020   Lab Results  Component Value Date   TSH 2.250 12/11/2020   No results found for: HGBA1C Lab Results  Component Value Date   WBC 6.2 12/11/2020   HGB 15.2 12/11/2020   HCT 45.0 12/11/2020   MCV 96 12/11/2020   PLT 232 12/11/2020   Lab Results  Component Value Date   ALT 25 12/11/2020   AST 30 12/11/2020   ALKPHOS 76 12/11/2020   BILITOT 0.3 12/11/2020   No results found for: 25OHVITD2, 25OHVITD3, VD25OH   Review of Systems  Constitutional:  Negative for appetite change, chills, diaphoresis, fatigue and unexpected weight change.  HENT:  Positive for tinnitus. Negative for hearing loss, trouble swallowing and voice change.   Eyes:  Negative for visual disturbance.  Respiratory:  Negative for choking, shortness of breath and wheezing.   Cardiovascular:  Negative for chest pain, palpitations and leg swelling.  Gastrointestinal:  Positive for heartburn. Negative for abdominal pain, blood in stool, constipation and diarrhea.  Genitourinary:  Negative for difficulty urinating, dysuria and frequency.  Musculoskeletal:  Positive for gait problem (plantar fasciitis). Negative for arthralgias, back pain  and myalgias.  Skin:  Positive for wound (lesion on left arm). Negative for color change and rash.  Neurological:  Negative for dizziness, syncope and headaches.  Hematological:  Negative for adenopathy.  Psychiatric/Behavioral:  Positive for depression. Negative for dysphoric mood and sleep disturbance. The patient is not nervous/anxious.    Patient Active Problem List    Diagnosis Date Noted   Restless leg syndrome 12/11/2020   History of melanoma in situ 12/10/2019   Mixed hyperlipidemia 12/07/2018   IBS (irritable bowel syndrome) 12/07/2018   Ganglion cyst of dorsum of right wrist 12/07/2018   Hyperplastic polyp of large intestine    Major depressive disorder with single episode, in partial remission ([place]) 11/29/2016   Basal cell carcinoma, arm 07/24/2016   Gout 07/24/2016   Arthritis of both hands 07/24/2016   PVC's (premature ventricular contractions)    Coronary atherosclerosis of native coronary artery 06/22/2013    Allergies  Allergen Reactions   Vascepa [Icosapent Ethyl]     Worsening diarrhea    Past Surgical History:  Procedure Laterality Date   CARDIAC CATHETERIZATION  06/22/2013   COLONOSCOPY WITH PROPOFOL N/A 09/01/2018   Procedure: COLONOSCOPY WITH PROPOFOL;  Surgeon: [name], MD;  Location: [facility] ENDOSCOPY;  Service: Endoscopy;  Laterality: N/A;   CORONARY ANGIOPLASTY WITH STENT PLACEMENT  06/22/2013   stent placement in the distal RCA & mid RCA   INCISION AND DRAINAGE PERIRECTAL ABSCESS N/A 04/01/2016   Procedure: [place];  Surgeon: Christene Lye, MD;  Location: [facility] ORS;  Service: General;  Laterality: N/A;    Social History   Tobacco Use   Smoking status: Never   Smokeless tobacco: Never  Substance Use Topics   Alcohol use: Yes    Comment: 5-10 Beers or Glasses of Wine Weekly   Drug use: No     Medication list has been reviewed and updated.  Current Meds  Medication Sig   aspirin EC 81 MG tablet Take 1 tablet (81 mg total) by mouth daily.   celecoxib (CELEBREX) 200 MG capsule TAKE 1 CAPSULE DAILY   cycloSPORINE (RESTASIS) 0.05 % ophthalmic emulsion Place 1 drop into both eyes 2 (two) times daily.   diphenoxylate-atropine (LOMOTIL) 2.5-0.025 MG tablet TAKE 1-2 TABS BY MOUTH 4 TIMES DAILY AS NEEDED FOR DIARRHEA/LOOSE STOOLS   escitalopram (LEXAPRO) 10 MG tablet TAKE 1  TABLET DAILY   fenofibrate (TRICOR) 145 MG tablet Take 1 tablet (145 mg total) by mouth daily.   Methylcellulose, Laxative, (CITRUCEL PO) Take 1 tablet by mouth daily.   OMEPRAZOLE PO Take 10 mg by mouth daily.   rosuvastatin (CRESTOR) 20 MG tablet Take 1 tablet (20 mg total) by mouth daily.    PHQ 2/9 Scores 12/17/2021 12/11/2020 12/10/2019 05/26/2019  PHQ - 2 Score 0 0 0 0  PHQ- 9 Score 0 0 0 0    GAD 7 : Generalized Anxiety Score 12/17/2021 12/11/2020 07/24/2016  Nervous, Anxious, on Edge 0 0 1  Control/stop worrying 0 0 3  Worry too much - different things 0 0 3  Trouble relaxing 0 0 1  Restless 0 0 0  Easily annoyed or irritable 0 0 1  Afraid - awful might happen 0 0 1  Total GAD 7 Score 0 0 10  Anxiety Difficulty Not difficult at all Not difficult at all Very difficult    BP Readings from Last 3 Encounters:  12/17/21 118/84  11/27/21 120/80  12/11/20 138/82    Physical Exam Vitals and nursing  note reviewed.  Constitutional:      Appearance: Normal appearance. He is well-developed.  HENT:     Head: Normocephalic.     Right Ear: Tympanic membrane, ear canal and external ear normal.     Left Ear: Tympanic membrane, ear canal and external ear normal.     Nose: Nose normal.  Eyes:     Conjunctiva/sclera: Conjunctivae normal.     Pupils: Pupils are equal, round, and reactive to light.  Neck:     Thyroid: No thyromegaly.     Vascular: No carotid bruit.  Cardiovascular:     Rate and Rhythm: Normal rate and regular rhythm.     Heart sounds: Normal heart sounds.  Pulmonary:     Effort: Pulmonary effort is normal.     Breath sounds: Normal breath sounds. No wheezing.  Chest:  Breasts:    Right: No mass.     Left: No mass.  Abdominal:     General: Bowel sounds are normal.     Palpations: Abdomen is soft.     Tenderness: There is no abdominal tenderness.  Musculoskeletal:        General: Normal range of motion.     Cervical back: Normal range of motion and neck supple.      Right lower leg: No edema.     Comments: Left foot in surgical boot  Lymphadenopathy:     Cervical: No cervical adenopathy.  Skin:    General: Skin is warm and dry.     Capillary Refill: Capillary refill takes less than 2 seconds.  Neurological:     General: No focal deficit present.     Mental Status: He is alert and oriented to person, place, and time.     Deep Tendon Reflexes: Reflexes are normal and symmetric.  Psychiatric:        Attention and Perception: Attention normal.        Mood and Affect: Mood normal.        Thought Content: Thought content normal.    Wt Readings from Last 3 Encounters:  12/17/21 205 lb (93 kg)  11/27/21 206 lb (93.4 kg)  12/11/20 202 lb (91.6 kg)    BP 118/84    Pulse 72    Ht 6' (1.829 m)    Wt 205 lb (93 kg)    SpO2 97%    BMI 27.80 kg/m   Assessment and Plan: 1. Annual physical exam Normal exam. Continue healthy diet, exercise Up to date on screenings and immunizations. - CBC with Differential/Platelet - Comprehensive metabolic panel - Testosterone  2. Prostate cancer screening DRE deferred - PSA  3. Mixed hyperlipidemia Tolerating statin medication without side effects at this time LDL is at goal of < 70 on current dose Continue same therapy without change at this time. - Lipid panel  4. Major depressive disorder with single episode, in partial remission (HCC) Clinically stable on current regimen with good control of symptoms, No SI or HI. Will continue current therapy. Will also check testosterone levels - escitalopram (LEXAPRO) 10 MG tablet; Take 1 tablet (10 mg total) by mouth daily.  Dispense: 90 tablet; Refill: 3  5. Restless leg syndrome Mild intermittent sx If he decides he wants to try medication again, he will call for a higher dose.   Partially dictated using Editor, commissioning. Any errors are unintentional.  Halina Maidens, MD [facility]  12/17/2021

## 2021-12-18 ENCOUNTER — Other Ambulatory Visit: Payer: Self-pay

## 2021-12-18 DIAGNOSIS — R7989 Other specified abnormal findings of blood chemistry: Secondary | ICD-10-CM

## 2021-12-18 LAB — CBC WITH DIFFERENTIAL/PLATELET
Basophils Absolute: 0.1 10*3/uL (ref 0.0–0.2)
Basos: 2 %
EOS (ABSOLUTE): 0.3 10*3/uL (ref 0.0–0.4)
Eos: 4 %
Hematocrit: 45.6 % (ref 37.5–51.0)
Hemoglobin: 15.4 g/dL (ref 13.0–17.7)
Immature Grans (Abs): 0.2 10*3/uL — ABNORMAL HIGH (ref 0.0–0.1)
Immature Granulocytes: 2 %
Lymphocytes Absolute: 1.8 10*3/uL (ref 0.7–3.1)
Lymphs: 28 %
MCH: 31.4 pg (ref 26.6–33.0)
MCHC: 33.8 g/dL (ref 31.5–35.7)
MCV: 93 fL (ref 79–97)
Monocytes Absolute: 0.5 10*3/uL (ref 0.1–0.9)
Monocytes: 9 %
Neutrophils Absolute: 3.4 10*3/uL (ref 1.4–7.0)
Neutrophils: 55 %
Platelets: 256 10*3/uL (ref 150–450)
RBC: 4.9 x10E6/uL (ref 4.14–5.80)
RDW: 12.5 % (ref 11.6–15.4)
WBC: 6.2 10*3/uL (ref 3.4–10.8)

## 2021-12-18 LAB — COMPREHENSIVE METABOLIC PANEL
ALT: 41 IU/L (ref 0–44)
AST: 37 IU/L (ref 0–40)
Albumin/Globulin Ratio: 2.1 (ref 1.2–2.2)
Albumin: 4.8 g/dL (ref 3.8–4.8)
Alkaline Phosphatase: 67 IU/L (ref 44–121)
BUN/Creatinine Ratio: 18 (ref 10–24)
BUN: 20 mg/dL (ref 8–27)
Bilirubin Total: 0.4 mg/dL (ref 0.0–1.2)
CO2: 25 mmol/L (ref 20–29)
Calcium: 9.8 mg/dL (ref 8.6–10.2)
Chloride: 100 mmol/L (ref 96–106)
Creatinine, Ser: 1.14 mg/dL (ref 0.76–1.27)
Globulin, Total: 2.3 g/dL (ref 1.5–4.5)
Glucose: 102 mg/dL — ABNORMAL HIGH (ref 70–99)
Potassium: 4.4 mmol/L (ref 3.5–5.2)
Sodium: 138 mmol/L (ref 134–144)
Total Protein: 7.1 g/dL (ref 6.0–8.5)
eGFR: 72 mL/min/{1.73_m2} (ref >59)

## 2021-12-18 LAB — LIPID PANEL
Chol/HDL Ratio: 3.3 ratio (ref 0.0–5.0)
Cholesterol, Total: 168 mg/dL (ref 100–199)
HDL: 51 mg/dL (ref >39)
LDL Chol Calc (NIH): 77 mg/dL (ref 0–99)
Triglycerides: 247 mg/dL — ABNORMAL HIGH (ref 0–149)
VLDL Cholesterol Cal: 40 mg/dL (ref 5–40)

## 2021-12-18 LAB — PSA: Prostate Specific Ag, Serum: 0.8 ng/mL (ref 0.0–4.0)

## 2021-12-18 LAB — TESTOSTERONE: Testosterone: 247 ng/dL — ABNORMAL LOW (ref 264–916)

## 2021-12-18 NOTE — Progress Notes (Signed)
Testosterone order put in

## 2022-01-01 ENCOUNTER — Other Ambulatory Visit: Payer: Self-pay | Admitting: Internal Medicine

## 2022-01-03 NOTE — Progress Notes (Signed)
Formatting of this note is different from the original.          Orthopaedic Foot and Ankle Division  Encounter Provider: Allena Earing, MD  Date of Service: 01/03/2022 Last encounter Orthopaedics: 12/10/2021   Last encounter this provider: Visit date not found      Notes:      MDM (Two highest determine E&M code)   Problems 1 or more chronic illnesses with exacerbation, progression, or side effects of treatment (16606, 99214)   Data Independent interpretation of imaging (99204/99214)   Risk Moderate risk of morbidity from additional diagnostic testing or treatment (99204/99214)     Primary Care Provider: No PCP Per Patient  Referring Provider: Referred Self    Physical Function CAT Score: (not recorded)  Pain Interference CAT Score: (not recorded)  Depression CAT Score: (not recorded)  Sleep CAT Score: (not recorded)  [email or web address]     History:  (Data reviewed and verified by encounter provider.)  Chief Complaint   Patient presents with   ? Left Great Toe - Pain     Eval     HPI:  66 y.o. male who presents for evaluation of left 1st MTP pain. He has a 5 year hx of progressive big toe pain. He reports a toe injury when a piece of plywood fell on to his big toe.  Pain Assessment: 0-10  0-10 Pain Scale: 3    Medical History No past medical history on file.   Surgical History No past surgical history on file.   Allergies Icosapent ethyl   Medications He has a current medication list which includes the following prescription(s): celecoxib, cyclosporine, diclofenac, diphenoxylate-atropine, escitalopram oxalate, ezetimibe, loteprednol etabonate, meloxicam, methylcellulose (laxative), naproxen sodium, ropinirole, and rosuvastatin.   Family History His family history is not on file.   Social History He reports that he has never smoked. He does not have any smokeless tobacco history on file. He reports current alcohol use.Home address:[address]  [place]  [address]  Occupation:       Occupational History   ? Not on file     Social History     Social History Narrative   ? Not on file         Exam:  The encounter diagnosis was Acquired hallux rigidus of left foot.   There is no height or weight on file to calculate BMI.     Musculoskeletal   Left foot    ? Restricted 1st MTP motion  ? + grind test   ? 15 DF, 0 PF  ? Large dorsal osteophyte on distal 1st metatarsal        Pulses well-perfused distally, 2+ DP and posterior tibial pulses    Swelling moderate swelling    Neurologic Sensation to light touch distally normal   Skin Benign, no lesions      Test Results  The encounter diagnosis was Acquired hallux rigidus of left foot.  No results found for: A1C    No results found for: VITD    Imaging  No orders of the defined types were placed in this encounter.    Previous imaging was personally reviewed and interpreted by the encounter provider today..     Hallux rigidus with joint space narrowing, osteophyte formation    Brett Mcclain is a 66 y.o. male   ASSESSMENT       ICD-10-CM   1. Acquired hallux rigidus of left foot  M20.22    Orthopaedic notes: No  specialty comments available.      PLAN:   DECISION FOR SURGERY   Operative and non-operative options discussed.  Given the identifiable pathology and the patient's desire to proceed, I offered a surgical plan as outlined below. The usual discussion involved that of goals, likelihood of success, alternatives, risks, and costs, also given on preoperative information.  Physical risks of operative treatment including infection, bleeding, neurovascular damage, amputation, and risks undergoing anesthesia were discussed and/or given as information in the preoperative information.  We specifically addressed the risks as outline in Patient Instructions.  Benefits also discussed.  Verbal consent for the procedure was given by the patient.      Procedure: left 1st MTP fusion  Planned Date:   Medical/Anesthesia concerns to address  prior to surgery:   Anesthesia:   Planned Location:   Disposition:      Position: supine     ? Patient education info printed  ? Pt going to think about options and will contact me if he would like to proceed  ?     Scheduling Notes:  Scheduled With [facility], MD: Visit date not found  With Orthopaedics: Visit date not found    - Xrays next visit in same ortho team: none       Requested Prescriptions      No prescriptions requested or ordered in this encounter     No orders of the defined types were placed in this encounter.    DME ORDER:  Dx:  ,          Electronically signed by Johnell Comings, MD at 01/05/2022  3:41 PM EST

## 2022-01-06 LAB — TESTOSTERONE,FREE AND TOTAL
Testosterone, Free: 6.7 pg/mL (ref 6.6–18.1)
Testosterone: 306 ng/dL (ref 264–916)

## 2022-01-07 ENCOUNTER — Other Ambulatory Visit: Payer: Self-pay | Admitting: Internal Medicine

## 2022-01-07 DIAGNOSIS — E349 Endocrine disorder, unspecified: Secondary | ICD-10-CM | POA: Insufficient documentation

## 2022-01-07 MED ORDER — TESTOSTERONE 20.25 MG/1.25GM (1.62%) TD GEL: 1.0000 | Freq: Every day | TRANSDERMAL | 0 refills | Status: AC

## 2022-01-24 ENCOUNTER — Telehealth: Payer: Self-pay | Admitting: Internal Medicine

## 2022-01-24 NOTE — Telephone Encounter (Signed)
Medication Refill - Medication: escitalopram (LEXAPRO) 10 MG tablet ?Testosterone 20.25 MG/1.25GM (1.62%) GEL Pt stated he would like this medication sent to [place] -  ? ?Has the patient contacted their pharmacy? Yes.   ? ?(Agent: If yes, when and what did the pharmacy advise?) ? ?Preferred Pharmacy (with phone number or street name):  ?[facility] [place] [facility] Registered [facility] Sites  ?[address] [place] [address]  ?Phone: [phone] Fax: [phone]  ?Hours: Not open 24 hours  ? ?Has the patient been seen for an appointment in the last year OR does the patient have an upcoming appointment? Yes.   ? ?Agent: Please be advised that RX refills may take up to 3 business days. We ask that you follow-up with your pharmacy.  ?

## 2022-02-21 ENCOUNTER — Other Ambulatory Visit: Payer: Self-pay | Admitting: Internal Medicine

## 2022-02-21 DIAGNOSIS — M19041 Primary osteoarthritis, right hand: Secondary | ICD-10-CM

## 2022-02-22 NOTE — Telephone Encounter (Signed)
Requested Prescriptions  ?Pending Prescriptions Disp Refills  ?? celecoxib (CELEBREX) 200 MG capsule [Pharmacy Med Name: CELECOXIB CAP 200MG] 90 capsule 3  ?  Sig: TAKE 1 CAPSULE DAILY  ?  ? Analgesics:  COX2 Inhibitors Failed - 02/21/2022  8:07 AM  ?  ?  Failed - Manual Review: Labs are only required if the patient has taken medication for more than 8 weeks.  ?  ?  Passed - HGB in normal range and within 360 days  ?  Hemoglobin  ?Date Value Ref Range Status  ?12/17/2021 15.4 13.0 - 17.7 g/dL Final  ?   ?  ?  Passed - Cr in normal range and within 360 days  ?  Creatinine, Ser  ?Date Value Ref Range Status  ?12/17/2021 1.14 0.76 - 1.27 mg/dL Final  ?   ?  ?  Passed - HCT in normal range and within 360 days  ?  Hematocrit  ?Date Value Ref Range Status  ?12/17/2021 45.6 37.5 - 51.0 % Final  ?   ?  ?  Passed - AST in normal range and within 360 days  ?  AST  ?Date Value Ref Range Status  ?12/17/2021 37 0 - 40 IU/L Final  ?   ?  ?  Passed - ALT in normal range and within 360 days  ?  ALT  ?Date Value Ref Range Status  ?12/17/2021 41 0 - 44 IU/L Final  ?   ?  ?  Passed - eGFR is 30 or above and within 360 days  ?  GFR calc Af Amer  ?Date Value Ref Range Status  ?12/11/2020 89 >59 mL/min/1.73 Final  ?  Comment:  ?  **In accordance with recommendations from the NKF-ASN Task force,** ?  [facility] is in the process of updating its eGFR calculation to the ?  2021 CKD-EPI creatinine equation that estimates kidney function ?  without a race variable. ?  ? ?GFR calc non Af Amer  ?Date Value Ref Range Status  ?12/11/2020 77 >59 mL/min/1.73 Final  ? ?eGFR  ?Date Value Ref Range Status  ?12/17/2021 72 >59 mL/min/1.73 Final  ?   ?  ?  Passed - Patient is not pregnant  ?  ?  Passed - Valid encounter within last 12 months  ?  Recent Outpatient Visits   ?      ? 2 months ago Annual physical exam  ? [facility] Glean Hess, MD  ? 1 year ago Annual physical exam  ? [facility] Glean Hess, MD  ? 2 years ago  Annual physical exam  ? [facility] Glean Hess, MD  ? 2 years ago Arthritis of hand  ? [facility] Glean Hess, MD  ? 2 years ago Need for shingles vaccine  ? [facility] Glean Hess, MD  ?  ?  ?Future Appointments   ?        ? In 9 months Glean Hess, MD [facility], [place]  ?  ? ?  ?  ?  ? ?

## 2022-02-28 ENCOUNTER — Other Ambulatory Visit: Payer: Self-pay | Admitting: Cardiovascular Disease

## 2022-03-04 ENCOUNTER — Other Ambulatory Visit: Payer: Self-pay

## 2022-03-04 MED ORDER — ROSUVASTATIN CALCIUM 20 MG PO TABS: 20.0000 mg | ORAL_TABLET | Freq: Every day | ORAL | 1 refills | Status: DC

## 2022-03-22 NOTE — Progress Notes (Signed)
Associated Order(s): Small Joint Injection  Post-Procedure Diagnose(s): De Quervain's tenosynovitis, left  Formatting of this note might be different from the original.  Small Joint Injection    Date/Time: 03/22/2022 11:45 AM  Performed by: Edilia Bo, PA  Authorized by: Edilia Bo, PA     Needle Size:  25 G  Topical skin anesthesia: obtained using ethyl chloride spray    Medications:  40 mg triamcinolone acetonide 40 mg/mL; 1 mL lidocaine 1 %      Electronically signed by Edilia Bo, PA at 03/22/2022 11:52 AM EDT

## 2022-03-22 NOTE — Progress Notes (Signed)
Formatting of this note is different from the original.  Images from the original note were not included.  Chief Complaint  Chief Complaint   Patient presents with   ? Left Wrist - Pain, Injections     Reason for Visit  The patient is a 66 year old male that presented for complaints of his left thumb.  He denies any injury or trauma throughout 3 weeks ago he started having pain involving the thumb base the wrist after doing renovation of his house.  He continues to have pain with certain gripping activities.  He denies any redness or warmth but does have some swelling.  There is occasional sharp pains but no real popping in the thumb.  He has no numbness or tingling to the hand.  The patient is not a diabetic.  He does take Celebrex daily.    Medications  Current Outpatient Medications   Medication Sig Dispense Refill   ? aspirin 81 MG EC tablet Take by mouth.     ? celecoxib (CELEBREX) 200 MG capsule Take by mouth     ? cyclobenzaprine (FLEXERIL) 10 MG tablet TAKE 1 TABLET BY MOUTH THREE TIMES A DAY AS NEEDED FOR MUSCLE SPASMS     ? escitalopram oxalate (LEXAPRO) 10 MG tablet Take by mouth.     ? naproxen sodium (ALEVE) 220 MG tablet Take by mouth     ? rOPINIRole (REQUIP) 0.5 MG tablet Take by mouth     ? rosuvastatin (CRESTOR) 20 MG tablet Take by mouth.       No current facility-administered medications for this visit.     Allergies  Icosapent ethyl    Histories  Past Medical History:  Past Medical History:   Diagnosis Date   ? Hyperlipidemia      Past Surgical History:  Past Surgical History:   Procedure Laterality Date   ? CORONARY ANGIOPLASTY      With stent insertion     Social History:  Social History     Socioeconomic History   ? Marital status: Married   Tobacco Use   ? Smoking status: Never   ? Smokeless tobacco: Former   Substance and Sexual Activity   ? Alcohol use: Yes     Alcohol/week: 24.0 standard drinks     Types: 12 Glasses of wine, 12 Cans of beer per week   ? Drug use: No   ? Sexual activity:  Yes     Partners: Male     Birth control/protection: None     Family History:  No family history on file.    Review of Systems  A comprehensive 14 point ROS was performed, reviewed, and the pertinent orthopaedic findings are documented in the HPI.    Exam  BP 129/76   Pulse 57   Ht 182.9 cm (6')   Wt 92.1 kg (203 lb)   BMI 27.53 kg/m      General/Constitutional: NAD, conversant  Eyes: Pupils equal and round, extraocular movements intact  ENT: atraumatic external nose and ears, moist mucous membranes  Respiratory: non-labored breathing, symmetric chest rise, chest sounds clear.  Cardiovascular: no visible lower extremity edema, peripheral pulses present, regular rate and rhythm   Skin: normal skin turgor, warm and dry  Neurological: cranial nerves grossly intact, sensation grossly intact  Psychological:  Appropriate mood and affect; appropriate judgment  Musculoskeletal: as detailed below:    General:  Well developed, well nourished 66 y.o. male in no apparent distress.  Normal affect.  Normal communication.  Patient answers questions appropriately.  The patient has a normal gait.  There is no antalgic component.  There is no hip lurch.      Left upper Extremity:  Examination of the left extremity revealed no bony abnormality, edema, or ecchymosis.  The patient has normal active and passive motion and stability of the shoulder, elbow, and wrist.  The patient has full composite fist.  There is positive thumb pain with grip.  The patient is non-tender in the wrist and snuffbox.  The patient has positive distal radial without distal ulnar discomfort.  The patient has a negative axial load test.  The patient has normal radial and ulnar deviation.  The patient has a positive Finkelstein test.  There is no triggering or locking of the digits.  There is no instability of the fingers and no pain with motion.    Neurological:  There is no thenar atrophy.  There is no intrinsic wasting.  The patient has a negative  Phalen's test and a negative Tinel's test.  The patient has sensation that is intact to light touch and pinprick bilaterally.  The patient has normal grip strength and pincer strength.  The patient has full biceps, wrist extension, grip, and interosseous strength.  The patient has 2 + DTRs bilaterally.  No clonus or tremor.  Good motor coordination.    Vascular:  The patient has less than 2 second capillary refill.  The patient has normal ulnar and radial pulses.  The patient has normal skin warmth to touch.      Impression  Encounter Diagnosis   Name Primary?   ? De Quervain's tenosynovitis, left Yes     Plan    1.  I discussed the physical exam finding as well as previous treatment and mechanism of injury.  2.  I discussed doing a left thumb de Quervain's cortisone injection for the patient.  The patient agreed and consented to the procedure after reviewing the risks, benefits, and alternatives.    3.  The patient was encouraged to continue his universal thumb splint for stability of the thumb joint.  This was dispensed to him in the office.  4.  The patient will follow up if no improvement for possible physical therapy.    Left wrist Dequervein's Cortisone Injection Procedure:  Consent   After discussing the various treatment options for the condition, It was agreed that a left wrist Dequervain's cortisone injection would be the next step in treatment. The nature of and the indications for a corticosteroid and / or local anaesthetic injection were reviewed in detail with the patient today. The inherent risks of injection including infection, allergic reaction, increased pain, incomplete relief or temporary relief of symptoms, alterations of blood glucose levels requiring careful monitoring and treatment as indicated, tendon, ligament or articular cartilage rupture or degeneration, nerve injury, skin depigmentation, and/or fat atrophy were discussed.   Procedure   After the risks and benefits of the procedure were  explained, consent was given, and time-out was performed. The site for the injection was properly marked.  The dorsal surface of the left radial styloid and Thumb Extensor was prepped with Betadine.  The injection site was anesthetized with ethyl chloride.  The tendon sheath was injected with a peritendinous procedure, using 1 cc of 1% Xylocaine, and < 1 cc of 10 mg of Kenalog.  The procedure was well tolerated.   The patient may proceed with normal function but otherwise rest the joint for a few more days before  resuming regular activities. It may be more painful for the first 1-2 days. Watch for fever, or increased swelling or persistent pain in the joint. Call or return to clinic if such symptoms occur or there is failure to improve as anticipated.    This note was generated in part with voice recognition software and I apologize for any typographical errors that were not detected and corrected.    Myrna BlazerJonathan Todd Mundy PA-C, M.S.       Electronically signed by Edilia BoMundy, Jonathan Todd, PA at 03/22/2022 11:52 AM EDT

## 2022-06-03 DIAGNOSIS — L570 Actinic keratosis: Secondary | ICD-10-CM | POA: Diagnosis not present

## 2022-06-03 DIAGNOSIS — Z85820 Personal history of malignant melanoma of skin: Secondary | ICD-10-CM | POA: Diagnosis not present

## 2022-06-03 DIAGNOSIS — L578 Other skin changes due to chronic exposure to nonionizing radiation: Secondary | ICD-10-CM | POA: Diagnosis not present

## 2022-06-03 DIAGNOSIS — Z872 Personal history of diseases of the skin and subcutaneous tissue: Secondary | ICD-10-CM | POA: Diagnosis not present

## 2022-06-03 DIAGNOSIS — D225 Melanocytic nevi of trunk: Secondary | ICD-10-CM | POA: Diagnosis not present

## 2022-06-03 DIAGNOSIS — Z86018 Personal history of other benign neoplasm: Secondary | ICD-10-CM | POA: Diagnosis not present

## 2022-06-03 DIAGNOSIS — D485 Neoplasm of uncertain behavior of skin: Secondary | ICD-10-CM | POA: Diagnosis not present

## 2022-06-14 ENCOUNTER — Encounter: Payer: Self-pay | Admitting: Cardiovascular Disease

## 2022-06-15 NOTE — Progress Notes (Unsigned)
Cardiology Office Note    Date:  06/17/2022   ID:  Paul Garner, DOB [birth year], MRN [id number]  PCP:  Glean Hess, MD  Cardiologist:  Kathlyn Sacramento, MD  Electrophysiologist:  None   Chief Complaint: Increased PVCs  History of Present Illness:   Paul Garner is a 66 y.o. male with history of CAD status post PCI/DES to the mid/distal RCA in 05/2013, HLD, hypertriglyceridemia, and PVCs who presents for evaluation of increased PVCs.  His cardiac history dates back to 2014 when he underwent LHC with subsequent PCI/DES to the mid/distal RCA in the setting of stable angina.  Anginal equivalent at that time was exertional right-sided neck discomfort and dyspnea.  He had a negative stress test in 11/2015.  Outpatient cardiac monitoring in 2017 demonstrated a less than 1% burden of PVCs.  He experienced atypical chest pain in 2019 with treadmill MPI at that time showing excellent exercise capacity with an EF of 55 to 65% and was overall low risk.  He was last seen in the office in 10/2021 and was without symptoms of angina or decompensation.  It was noted his PVCs were worse after lunch, though not on a daily basis.  Symptoms were reported to be stable.  He contacted our office earlier on 06/14/2022 noting an increase in PVCs that would typically last for 1 to 3 hours.  It was recommended he start Lopressor 12.5 mg twice daily.  Appointment was scheduled for today.  He comes in today noting an increase in PVC burden.  Symptoms initially began in the setting of a GI illness in 02/2022 with associated nausea, vomiting, and diarrhea.  Upon improvement of this GI illness, he did notice a slight improvement in his PVC burden, though not resolution or back to his prior baseline.  More recently, PVCs have become increasingly frequent and are associated with chest tightness, neck discomfort, and dyspnea.  Symptoms do feel somewhat similar to what he was experiencing in 2014 leading up to his PCI,  though are currently more pronounced.  PVCs are more noticeable following his lunch.  He did start an OTC magnesium supplement that did improve his PVC burden somewhat, though not back to his prior baseline.   Labs independently reviewed: 11/2021 - TC 168, TG 247, HDL 51, LDL 77, potassium 4.4, BUN 20, SCr 1.14, albumin 4.8, AST/ALT normal, HGB 15,4, PLT 256 11/2020 - normal  Past Medical History:  Diagnosis Date   Coronary artery disease    a. 05/2013 s/p PCI/DES to the mid/distal RCA;  b. 11/2015 MV: EF 51%, no ischemia/infarct; c. 02/2018 ETT: Ex time 11:00. No isch/infarct.   Hyperlipidemia    PVC's (premature ventricular contractions)    a. 11/2015 48hr Holter: RSR, avg 77bpm. PVCs - <1%. Rare PACs, 4 beats SVT.    Past Surgical History:  Procedure Laterality Date   CARDIAC CATHETERIZATION  06/22/2013   COLONOSCOPY WITH PROPOFOL N/A 09/01/2018   Procedure: COLONOSCOPY WITH PROPOFOL;  Surgeon: [name], MD;  Location: [facility] ENDOSCOPY;  Service: Endoscopy;  Laterality: N/A;   CORONARY ANGIOPLASTY WITH STENT PLACEMENT  06/22/2013   stent placement in the distal RCA & mid RCA   INCISION AND DRAINAGE PERIRECTAL ABSCESS N/A 04/01/2016   Procedure: [place];  Surgeon: Christene Lye, MD;  Location: [facility] ORS;  Service: General;  Laterality: N/A;   MOHS SURGERY     stomach area    Current Medications: Current Meds  Medication Sig  aspirin EC 81 MG tablet Take 1 tablet (81 mg total) by mouth daily.   celecoxib (CELEBREX) 200 MG capsule TAKE 1 CAPSULE DAILY   cycloSPORINE (RESTASIS) 0.05 % ophthalmic emulsion Place 1 drop into both eyes 2 (two) times daily.   diphenoxylate-atropine (LOMOTIL) 2.5-0.025 MG tablet TAKE 1-2 TABS BY MOUTH 4 TIMES DAILY AS NEEDED FOR DIARRHEA/LOOSE STOOLS   escitalopram (LEXAPRO) 10 MG tablet Take 1 tablet (10 mg total) by mouth daily.   fenofibrate (TRICOR) 145 MG tablet Take 1 tablet (145 mg total) by mouth daily.    Methylcellulose, Laxative, (CITRUCEL PO) Take 1 tablet by mouth daily.   OMEPRAZOLE PO Take 10 mg by mouth daily.   rosuvastatin (CRESTOR) 20 MG tablet Take 1 tablet (20 mg total) by mouth daily.    Allergies:   Vascepa [icosapent ethyl]   Social History   Socioeconomic History   Marital status: Married    Spouse name: Not on file   Number of children: Not on file   Years of education: Not on file   Highest education level: Not on file  Occupational History   Not on file  Tobacco Use   Smoking status: Never   Smokeless tobacco: Never  Substance and Sexual Activity   Alcohol use: Yes    Comment: 5-10 Beers or Glasses of Wine Weekly   Drug use: No   Sexual activity: Yes    Partners: Female  Other Topics Concern   Not on file  Social History Narrative   Not on file   Social Determinants of Health   Financial Resource Strain: Not on file  Food Insecurity: Not on file  Transportation Needs: Not on file  Physical Activity: Not on file  Stress: Not on file  Social Connections: Not on file     Family History:  The patient's family history includes Cancer (age of onset: 32) in his mother; Heart attack (age of onset: 57) in his father; Heart disease in his father; Hyperlipidemia in his father; Rheum arthritis in his paternal grandmother.  ROS:   12-point review of systems is negative unless otherwise noted in the HPI.   EKGs/Labs/Other Studies Reviewed:    Studies reviewed were summarized above. The additional studies were reviewed today:  Treadmill MPI 03/09/2018: Blood pressure demonstrated a normal response to exercise. Excellent exercise capacity. There was no ST segment deviation noted during stress. No T wave inversion was noted during stress. The study is normal. This is a low risk study. The left ventricular ejection fraction is normal (55-65%). __________  48 hour Holter 11/2015: Normal sinus rhythm,  average heart rate 77 bpm PVCs noted, rare couplet,  Less than 1% of total beats Rare APC noted, one run of 4 beats  Mean HR 77 Range HR 52-120  Total beats 219805 PVC 1773 PAC 34   Tachycardia nonsustained atrial tachycardia   Pauses none    Symptoms noted -palpitations  Sinus rhtyhm albeit a little fast   Rate control       Conclusion  PVC frequency not sufficiently frquent to be a problem __________  Treadmill MPI 12/12/2015: There was no ST segment deviation noted during stress.   Exercise myocardial perfusion imaging study with no significant  ischemia Normal wall motion, EF estimated at 51% No EKG changes concerning for ischemia at peak stress or in recovery. Good exercise tolerance, Achieved >10 METS Low risk scan    EKG:  EKG is ordered today.  The EKG ordered today demonstrates NSR, 72 bpm,  occasional PVCs, no acute ST-T changes  Recent Labs: 12/17/2021: ALT 41; BUN 20; Creatinine, Ser 1.14; Hemoglobin 15.4; Platelets 256; Potassium 4.4; Sodium 138  Recent Lipid Panel    Component Value Date/Time   CHOL 168 12/17/2021 0826   TRIG 247 (H) 12/17/2021 0826   HDL 51 12/17/2021 0826   CHOLHDL 3.3 12/17/2021 0826   CHOLHDL 3.3 12/12/2015 1007   VLDL 29 12/12/2015 1007   LDLCALC 77 12/17/2021 0826    PHYSICAL EXAM:    VS:  BP 120/64 (BP Location: Left Arm, Patient Position: Sitting, Cuff Size: Normal)   Pulse 72   Ht 6' (1.829 m)   Wt 202 lb 8 oz (91.9 kg)   SpO2 96%   BMI 27.46 kg/m   BMI: Body mass index is 27.46 kg/m.  Physical Exam Vitals reviewed.  Constitutional:      Appearance: He is well-developed.  HENT:     Head: Normocephalic and atraumatic.  Eyes:     General:        Right eye: No discharge.        Left eye: No discharge.  Neck:     Vascular: No JVD.  Cardiovascular:     Rate and Rhythm: Normal rate and regular rhythm. Occasional Extrasystoles are present.    Pulses:          Posterior tibial pulses are 2+ on the right side and 2+ on the left side.     Heart sounds: Normal heart  sounds, S1 normal and S2 normal. Heart sounds not distant. No midsystolic click and no opening snap. No murmur heard.    No friction rub.  Pulmonary:     Effort: Pulmonary effort is normal. No respiratory distress.     Breath sounds: Normal breath sounds. No decreased breath sounds, wheezing or rales.  Chest:     Chest wall: No tenderness.  Abdominal:     General: There is no distension.  Musculoskeletal:     Cervical back: Normal range of motion.     Right lower leg: No edema.     Left lower leg: No edema.  Skin:    General: Skin is warm and dry.     Nails: There is no clubbing.  Neurological:     Mental Status: He is alert and oriented to person, place, and time.  Psychiatric:        Speech: Speech normal.        Behavior: Behavior normal.        Thought Content: Thought content normal.        Judgment: Judgment normal.     Wt Readings from Last 3 Encounters:  06/17/22 202 lb 8 oz (91.9 kg)  12/17/21 205 lb (93 kg)  11/27/21 206 lb (93.4 kg)     ASSESSMENT & PLAN:   CAD involving the native coronary arteries with other forms of angina: Currently chest pain-free.  Symptoms are exacerbated with increase in PVC burden.  Schedule echo to evaluate for any structural changes.  Schedule Lexiscan MPI to evaluate for high risk ischemia.  He otherwise remains on aspirin, fenofibrate, and rosuvastatin.  PVCs: Increase in PVC burden that dates back approximately 3 months, though more recently burden has become more noticeable.  Place a 3-day Zio patch to quantify PVC burden.  Obtain echo and Lexiscan MPI as outlined above.  Check BMP, magnesium, CBC, and TSH.  He should continue OTC magnesium supplement.  Based on Zio patch results, will likely pursue initiation of metoprolol.  HLD/hypertriglyceridemia: LDL 87 with a triglyceride of 247.  In the past, he did not tolerate Vascepa secondary to GI upset.  He remains on rosuvastatin and fenofibrate.   Disposition: F/u with Dr. Fletcher Anon or an  APP in 1 month.   Medication Adjustments/Labs and Tests Ordered: Current medicines are reviewed at length with the patient today.  Concerns regarding medicines are outlined above. Medication changes, Labs and Tests ordered today are summarized above and listed in the Patient Instructions accessible in Encounters.   Signed, Christell Faith, PA-C 06/17/2022 4:17 PM     [place],  [address] [phone]

## 2022-06-17 ENCOUNTER — Encounter: Payer: Self-pay | Admitting: Physician Assistant

## 2022-06-17 ENCOUNTER — Other Ambulatory Visit
Admission: RE | Admit: 2022-06-17 | Discharge: 2022-06-17 | Disposition: A | Discharge status: Home / Self Care | Payer: BC Managed Care – PPO | Source: Ambulatory Visit | Attending: Physician Assistant | Admitting: Physician Assistant

## 2022-06-17 ENCOUNTER — Ambulatory Visit: Payer: BC Managed Care – PPO | Admitting: Physician Assistant

## 2022-06-17 ENCOUNTER — Ambulatory Visit (INDEPENDENT_AMBULATORY_CARE_PROVIDER_SITE_OTHER): Payer: BC Managed Care – PPO

## 2022-06-17 VITALS — BP 120/64 | HR 72 | Ht 72.0 in | Wt 202.5 lb

## 2022-06-17 DIAGNOSIS — I493 Ventricular premature depolarization: Secondary | ICD-10-CM | POA: Diagnosis not present

## 2022-06-17 DIAGNOSIS — I2510 Atherosclerotic heart disease of native coronary artery without angina pectoris: Secondary | ICD-10-CM | POA: Insufficient documentation

## 2022-06-17 DIAGNOSIS — R072 Precordial pain: Secondary | ICD-10-CM | POA: Insufficient documentation

## 2022-06-17 DIAGNOSIS — E785 Hyperlipidemia, unspecified: Secondary | ICD-10-CM

## 2022-06-17 DIAGNOSIS — E781 Pure hyperglyceridemia: Secondary | ICD-10-CM

## 2022-06-17 DIAGNOSIS — I25118 Atherosclerotic heart disease of native coronary artery with other forms of angina pectoris: Secondary | ICD-10-CM

## 2022-06-17 LAB — CBC
HCT: 42.5 % (ref 39.0–52.0)
Hemoglobin: 14 g/dL (ref 13.0–17.0)
MCH: 31.1 pg (ref 26.0–34.0)
MCHC: 32.9 g/dL (ref 30.0–36.0)
MCV: 94.4 fL (ref 80.0–100.0)
Platelets: 262 10*3/uL (ref 150–400)
RBC: 4.5 MIL/uL (ref 4.22–5.81)
RDW: 12.8 % (ref 11.5–15.5)
WBC: 7.2 10*3/uL (ref 4.0–10.5)
nRBC: 0 % (ref 0.0–0.2)

## 2022-06-17 LAB — TSH: TSH: 3.163 u[IU]/mL (ref 0.350–4.500)

## 2022-06-17 LAB — BASIC METABOLIC PANEL
Anion gap: 9 (ref 5–15)
BUN: 26 mg/dL — ABNORMAL HIGH (ref 8–23)
CO2: 25 mmol/L (ref 22–32)
Calcium: 9.7 mg/dL (ref 8.9–10.3)
Chloride: 105 mmol/L (ref 98–111)
Creatinine, Ser: 1.35 mg/dL — ABNORMAL HIGH (ref 0.61–1.24)
GFR, Estimated: 59 mL/min — ABNORMAL LOW (ref >60)
Glucose, Bld: 97 mg/dL (ref 70–99)
Potassium: 4 mmol/L (ref 3.5–5.1)
Sodium: 139 mmol/L (ref 135–145)

## 2022-06-17 LAB — MAGNESIUM: Magnesium: 2.2 mg/dL (ref 1.7–2.4)

## 2022-06-17 NOTE — Progress Notes (Signed)
Formatting of this note is different from the original.      Cardiology Office Note      Date:  06/17/2022     ID:  Brett Mcclain, DOB 02-Mar-1956, MRN [id number]    PCP:  Reubin Milan, MD   Cardiologist:  Lorine Bears, MD   Electrophysiologist:  None     Chief Complaint: Increased PVCs    History of Present Illness:     Brett Mcclain is a 66 y.o. male with history of CAD status post PCI/DES to the mid/distal RCA in 05/2013, HLD, hypertriglyceridemia, and PVCs who presents for evaluation of increased PVCs.    His cardiac history dates back to 2014 when he underwent LHC with subsequent PCI/DES to the mid/distal RCA in the setting of stable angina.  Anginal equivalent at that time was exertional right-sided neck discomfort and dyspnea.  He had a negative stress test in 11/2015.  Outpatient cardiac monitoring in 2017 demonstrated a less than 1% burden of PVCs.  He experienced atypical chest pain in 2019 with treadmill MPI at that time showing excellent exercise capacity with an EF of 55 to 65% and was overall low risk.  He was last seen in the office in 10/2021 and was without symptoms of angina or decompensation.  It was noted his PVCs were worse after lunch, though not on a daily basis.  Symptoms were reported to be stable.    He contacted our office earlier on 06/14/2022 noting an increase in PVCs that would typically last for 1 to 3 hours.  It was recommended he start Lopressor 12.5 mg twice daily.  Appointment was scheduled for today.    He comes in today noting an increase in PVC burden.  Symptoms initially began in the setting of a GI illness in 02/2022 with associated nausea, vomiting, and diarrhea.  Upon improvement of this GI illness, he did notice a slight improvement in his PVC burden, though not resolution or back to his prior baseline.  More recently, PVCs have become increasingly frequent and are associated with chest tightness, neck discomfort, and dyspnea.  Symptoms do feel somewhat similar to  what he was experiencing in 2014 leading up to his PCI, though are currently more pronounced.  PVCs are more noticeable following his lunch.  He did start an OTC magnesium supplement that did improve his PVC burden somewhat, though not back to his prior baseline.    Labs independently reviewed:  11/2021 - TC 168, TG 247, HDL 51, LDL 77, potassium 4.4, BUN 20, SCr 1.14, albumin 4.8, AST/ALT normal, HGB 15,4, PLT 256  11/2020 - normal    Past Medical History:   Diagnosis Date    Coronary artery disease     a. 05/2013 s/p PCI/DES to the mid/distal RCA;  b. 11/2015 MV: EF 51%, no ischemia/infarct; c. 02/2018 ETT: Ex time 11:00. No isch/infarct.    Hyperlipidemia     PVC's (premature ventricular contractions)     a. 11/2015 48hr Holter: RSR, avg 77bpm. PVCs - <1%. Rare PACs, 4 beats SVT.     Past Surgical History:   Procedure Laterality Date    CARDIAC CATHETERIZATION  06/22/2013    COLONOSCOPY WITH PROPOFOL N/A 09/01/2018    Procedure: COLONOSCOPY WITH PROPOFOL;  Surgeon: Midge Minium, MD;  Location: [facility] ENDOSCOPY;  Service: Endoscopy;  Laterality: N/A;    CORONARY ANGIOPLASTY WITH STENT PLACEMENT  06/22/2013    stent placement in the distal RCA & mid RCA    INCISION  AND DRAINAGE PERIRECTAL ABSCESS N/A 04/01/2016    Procedure: IRRIGATION AND DEBRIDEMENT PERIRECTAL ABSCESS;  Surgeon: Kieth Brightly, MD;  Location: [facility] ORS;  Service: General;  Laterality: N/A;    MOHS SURGERY      stomach area     Current Medications:  Current Meds   Medication Sig    aspirin EC 81 MG tablet Take 1 tablet (81 mg total) by mouth daily.    celecoxib (CELEBREX) 200 MG capsule TAKE 1 CAPSULE DAILY    cycloSPORINE (RESTASIS) 0.05 % ophthalmic emulsion Place 1 drop into both eyes 2 (two) times daily.    diphenoxylate-atropine (LOMOTIL) 2.5-0.025 MG tablet TAKE 1-2 TABS BY MOUTH 4 TIMES DAILY AS NEEDED FOR DIARRHEA/LOOSE STOOLS    escitalopram (LEXAPRO) 10 MG tablet Take 1 tablet (10 mg total) by mouth daily.    fenofibrate (TRICOR) 145 MG  tablet Take 1 tablet (145 mg total) by mouth daily.    Methylcellulose, Laxative, (CITRUCEL PO) Take 1 tablet by mouth daily.    OMEPRAZOLE PO Take 10 mg by mouth daily.    rosuvastatin (CRESTOR) 20 MG tablet Take 1 tablet (20 mg total) by mouth daily.     Allergies:   Vascepa [icosapent ethyl]     Social History     Socioeconomic History    Marital status: Married     Spouse name: Not on file    Number of children: Not on file    Years of education: Not on file    Highest education level: Not on file   Occupational History    Not on file   Tobacco Use    Smoking status: Never    Smokeless tobacco: Never   Substance and Sexual Activity    Alcohol use: Yes     Comment: 5-10 Beers or Glasses of Wine Weekly    Drug use: No    Sexual activity: Yes     Partners: Female   Other Topics Concern    Not on file   Social History Narrative    Not on file     Social Determinants of Health     Financial Resource Strain: Not on file   Food Insecurity: Not on file   Transportation Needs: Not on file   Physical Activity: Not on file   Stress: Not on file   Social Connections: Not on file       Family History:   The patient's family history includes Cancer (age of onset: 12) in his mother; Heart attack (age of onset: 11) in his father; Heart disease in his father; Hyperlipidemia in his father; Rheum arthritis in his paternal grandmother.    ROS:    12-point review of systems is negative unless otherwise noted in the HPI.    EKGs/Labs/Other Studies Reviewed:      Studies reviewed were summarized above. The additional studies were reviewed today:    Treadmill MPI 03/09/2018:  Blood pressure demonstrated a normal response to exercise. Excellent exercise capacity.  There was no ST segment deviation noted during stress.  No T wave inversion was noted during stress.  The study is normal.  This is a low risk study.  The left ventricular ejection fraction is normal (55-65%).  __________    48 hour Holter 11/2015:  Normal sinus rhythm,    average heart rate 77 bpm  PVCs noted, rare couplet, Less than 1% of total beats  Rare APC noted, one run of 4 beats    Mean HR 77  Range HR 52-120    Total beats 219805  PVC 1773  PAC 34    Tachycardia nonsustained atrial tachycardia    Pauses none     Symptoms noted -palpitations  Sinus rhtyhm albeit a little fast    Rate control        Conclusion   PVC frequency not sufficiently frquent to be a problem  __________    Treadmill MPI 12/12/2015:  There was no ST segment deviation noted during stress.    Exercise myocardial perfusion imaging study with no significant  ischemia  Normal wall motion, EF estimated at 51%  No EKG changes concerning for ischemia at peak stress or in recovery.  Good exercise tolerance, Achieved >10 METS  Low risk scan    EKG:  EKG is ordered today.  The EKG ordered today demonstrates NSR, 72 bpm, occasional PVCs, no acute ST-T changes    Recent Labs:  12/17/2021: ALT 41; BUN 20; Creatinine, Ser 1.14; Hemoglobin 15.4; Platelets 256; Potassium 4.4; Sodium 138   Recent Lipid Panel    Component Value Date/Time    CHOL 168 12/17/2021 0826    TRIG 247 (H) 12/17/2021 0826    HDL 51 12/17/2021 0826    CHOLHDL 3.3 12/17/2021 0826    CHOLHDL 3.3 12/12/2015 1007    VLDL 29 12/12/2015 1007    LDLCALC 77 12/17/2021 0826     PHYSICAL EXAM:      VS:  BP 120/64 (BP Location: Left Arm, Patient Position: Sitting, Cuff Size: Normal)   Pulse 72   Ht 6' (1.829 m)   Wt 202 lb 8 oz (91.9 kg)   SpO2 96%   BMI 27.46 kg/m   BMI: Body mass index is 27.46 kg/m.    Physical Exam  Vitals reviewed.   Constitutional:       Appearance: He is well-developed.   HENT:      Head: Normocephalic and atraumatic.   Eyes:      General:         Right eye: No discharge.         Left eye: No discharge.   Neck:      Vascular: No JVD.   Cardiovascular:      Rate and Rhythm: Normal rate and regular rhythm. Occasional Extrasystoles are present.     Pulses:           Posterior tibial pulses are 2+ on the right side and 2+ on the  left side.      Heart sounds: Normal heart sounds, S1 normal and S2 normal. Heart sounds not distant. No midsystolic click and no opening snap. No murmur heard.     No friction rub.   Pulmonary:      Effort: Pulmonary effort is normal. No respiratory distress.      Breath sounds: Normal breath sounds. No decreased breath sounds, wheezing or rales.   Chest:      Chest wall: No tenderness.   Abdominal:      General: There is no distension.   Musculoskeletal:      Cervical back: Normal range of motion.      Right lower leg: No edema.      Left lower leg: No edema.   Skin:     General: Skin is warm and dry.      Nails: There is no clubbing.   Neurological:      Mental Status: He is alert and oriented to person, place, and time.   Psychiatric:  Speech: Speech normal.         Behavior: Behavior normal.         Thought Content: Thought content normal.         Judgment: Judgment normal.     Wt Readings from Last 3 Encounters:   06/17/22 202 lb 8 oz (91.9 kg)   12/17/21 205 lb (93 kg)   11/27/21 206 lb (93.4 kg)       ASSESSMENT & PLAN:     CAD involving the native coronary arteries with other forms of angina: Currently chest pain-free.  Symptoms are exacerbated with increase in PVC burden.  Schedule echo to evaluate for any structural changes.  Schedule Lexiscan MPI to evaluate for high risk ischemia.  He otherwise remains on aspirin, fenofibrate, and rosuvastatin.    PVCs: Increase in PVC burden that dates back approximately 3 months, though more recently burden has become more noticeable.  Place a 3-day Zio patch to quantify PVC burden.  Obtain echo and Lexiscan MPI as outlined above.  Check BMP, magnesium, CBC, and TSH.  He should continue OTC magnesium supplement.  Based on Zio patch results, will likely pursue initiation of metoprolol.    HLD/hypertriglyceridemia: LDL 87 with a triglyceride of 247.  In the past, he did not tolerate Vascepa secondary to GI upset.  He remains on rosuvastatin and  fenofibrate.    Disposition: F/u with Dr. Kirke Corin or an APP in 1 month.    Medication Adjustments/Labs and Tests Ordered:  Current medicines are reviewed at length with the patient today.  Concerns regarding medicines are outlined above. Medication changes, Labs and Tests ordered today are summarized above and listed in the Patient Instructions accessible in Encounters.     Signed,  Eula Listen, PA-C  06/17/2022 4:17 PM       [facility] [place]  [address]  [place], [place] [address]  [phone]  Electronically signed by Sondra Barges, PA-C at 06/17/2022  4:17 PM EDT

## 2022-06-17 NOTE — Addendum Note (Signed)
Addended by: Kendrick Fries on: 06/25/2022 11:52 AM     Modules accepted: Orders      Electronically signed by Kendrick Fries, CMA at 06/25/2022 11:52 AM EDT

## 2022-06-17 NOTE — Patient Instructions (Addendum)
Medication Instructions:  No changes at this time.   *If you need a refill on your cardiac medications before your next appointment, please call your pharmacy*   Lab Work: CBC, BMET, TSH, and Mag over at the [employer] at [facility] then go to 1st desk on the right to check in (REGISTRATION)    If you have labs (blood work) drawn today and your tests are completely normal, you will receive your results only by: [place] (if you have MyChart) OR A paper copy in the mail If you have any lab test that is abnormal or we need to change your treatment, we will call you to review the results.   Testing/Procedures: Your physician has requested that you have an echocardiogram. Echocardiography is a painless test that uses sound waves to create images of your heart. It provides your doctor with information about the size and shape of your heart and how well your heart's chambers and valves are working. This procedure takes approximately one hour. There are no restrictions for this procedure.   [place]  Your caregiver has ordered a Stress Test with nuclear imaging. The purpose of this test is to evaluate the blood supply to your heart muscle. This procedure is referred to as a "Non-Invasive Stress Test." This is because other than having an IV started in your vein, nothing is inserted or "invades" your body. Cardiac stress tests are done to find areas of poor blood flow to the heart by determining the extent of coronary artery disease (CAD). Some patients exercise on a treadmill, which naturally increases the blood flow to your heart, while others who are  unable to walk on a treadmill due to physical limitations have a pharmacologic/chemical stress agent called Lexiscan . This medicine will mimic walking on a treadmill by temporarily increasing your coronary blood flow.   Please note: these test may take anywhere between 2-4 hours to complete  PLEASE REPORT TO [place] AT THE FIRST DESK WILL DIRECT YOU WHERE TO GO  Date of Procedure:___________________  Arrival Time for Procedure:_________________________  Instructions regarding medication:   _XX___ : You may take all of your medications with a sip of water.    PLEASE NOTIFY THE OFFICE AT LEAST 83 HOURS IN ADVANCE IF YOU ARE UNABLE TO KEEP YOUR APPOINTMENT.  [phone] AND  PLEASE NOTIFY NUCLEAR MEDICINE AT [facility] AT LEAST 24 HOURS IN ADVANCE IF YOU ARE UNABLE TO KEEP YOUR APPOINTMENT. [phone]  How to prepare for your Myoview test:  Do not eat or drink after midnight No caffeine for 24 hours prior to test No smoking 24 hours prior to test. Your medication may be taken with water.  If your doctor stopped a medication because of this test, do not take that medication. Ladies, please do not wear dresses.  Skirts or pants are appropriate. Please wear a short sleeve shirt. No perfume, cologne or lotion. Wear comfortable walking shoes. No heels!    Your physician has recommended that you wear a Zio monitor.   This monitor is a medical device that records the heart's electrical activity. Doctors most often use these monitors to diagnose arrhythmias. Arrhythmias are problems with the speed or rhythm of the heartbeat. The monitor is a small device applied to your chest. You can wear one while you do your normal daily activities. While wearing this monitor if you have any symptoms to push the button and record what you felt. Once you have  worn this monitor for the period of time provider prescribed (Usually 14 days), you will return the monitor device in the postage paid box. Once it is returned they will download the data collected and provide [place] with a report which the provider will then review and we will call you with those results. Important tips:  Avoid showering during the first 24 hours of wearing the monitor. Avoid excessive sweating to help maximize wear time. Do not  submerge the device, no hot tubs, and no swimming pools. Keep any lotions or oils away from the patch. After 24 hours you may shower with the patch on. Take brief showers with your back facing the shower head.  Do not remove patch once it has been placed because that will interrupt data and decrease adhesive wear time. Push the button when you have any symptoms and write down what you were feeling. Once you have completed wearing your monitor, remove and place into box which has postage paid and place in your outgoing mailbox.  If for some reason you have misplaced your box then call our office and we can provide another box and/or mail it off for you.     Follow-Up: At [facility], you and your health needs are our priority.  As part of our continuing mission to provide you with exceptional heart care, we have created designated Provider Care Teams.  These Care Teams include your primary Cardiologist (physician) and Advanced Practice Providers (APPs -  Physician Assistants and Nurse Practitioners) who all work together to provide you with the care you need, when you need it.   Your next appointment:   1 month(s)  The format for your next appointment:   In Person  Provider:   Kathlyn Sacramento, MD or Christell Faith, PA-C      Important Information About [place]

## 2022-06-18 ENCOUNTER — Telehealth: Payer: Self-pay | Admitting: *Deleted

## 2022-06-18 ENCOUNTER — Ambulatory Visit (INDEPENDENT_AMBULATORY_CARE_PROVIDER_SITE_OTHER): Payer: BC Managed Care – PPO

## 2022-06-18 ENCOUNTER — Other Ambulatory Visit: Payer: Self-pay | Admitting: *Deleted

## 2022-06-18 DIAGNOSIS — R072 Precordial pain: Secondary | ICD-10-CM | POA: Diagnosis not present

## 2022-06-18 DIAGNOSIS — E781 Pure hyperglyceridemia: Secondary | ICD-10-CM

## 2022-06-18 DIAGNOSIS — I2510 Atherosclerotic heart disease of native coronary artery without angina pectoris: Secondary | ICD-10-CM

## 2022-06-18 DIAGNOSIS — E785 Hyperlipidemia, unspecified: Secondary | ICD-10-CM

## 2022-06-18 DIAGNOSIS — I25118 Atherosclerotic heart disease of native coronary artery with other forms of angina pectoris: Secondary | ICD-10-CM

## 2022-06-18 LAB — ECHOCARDIOGRAM COMPLETE
AR max vel: 3.56 cm2
AV Area VTI: 2.93 cm2
AV Area mean vel: 3.22 cm2
AV Mean grad: 2 mmHg
AV Peak grad: 4.1 mmHg
Ao pk vel: 1.01 m/s
Area-P 1/2: 3.06 cm2
Calc EF: 48.4 %
S' Lateral: 3.4 cm
Single Plane A2C EF: 47 %
Single Plane A4C EF: 49.1 %

## 2022-06-18 MED ORDER — PERFLUTREN LIPID MICROSPHERE
1.0000 mL | INTRAVENOUS | Status: AC | PRN
  Administered 2022-06-18: 2 mL via INTRAVENOUS

## 2022-06-18 NOTE — Telephone Encounter (Signed)
Left voicemail message to call back for review of results.  

## 2022-06-18 NOTE — Telephone Encounter (Signed)
-----   Message from Rise Mu, PA-C sent at 06/18/2022  1:51 PM EDT ----- Please inform the patient his echo showed normal pump function, normal wall motion, mild thickening of the heart, slight stiffening of the heart, normal right-sided function, normal pressure within the right side of the heart, and no significant valvular abnormalities.  Overall, reassuring echo with recommendation to continue optimal blood pressure control.  Continue with plan outlined at his office visit on 8/21.

## 2022-06-18 NOTE — Telephone Encounter (Signed)
Reviewed results and recommendations with patient and he verbalized understanding with no further questions at this time.  

## 2022-06-19 ENCOUNTER — Ambulatory Visit
Admission: RE | Admit: 2022-06-19 | Discharge: 2022-06-19 | Disposition: A | Discharge status: Home / Self Care | Payer: BC Managed Care – PPO | Source: Ambulatory Visit | Attending: Internal Medicine | Admitting: Internal Medicine

## 2022-06-19 DIAGNOSIS — R072 Precordial pain: Secondary | ICD-10-CM | POA: Diagnosis not present

## 2022-06-19 DIAGNOSIS — I493 Ventricular premature depolarization: Secondary | ICD-10-CM | POA: Diagnosis not present

## 2022-06-19 LAB — NM MYOCAR MULTI W/SPECT W/WALL MOTION / EF
Estimated workload: 1
Exercise duration (min): 0 min
Exercise duration (sec): 0 s
LV dias vol: 87 mL (ref 62–150)
LV sys vol: 31 mL
MPHR: 157 {beats}/min
Nuc Stress EF: 64 %
Peak HR: 101 {beats}/min
Percent HR: 64 %
Rest HR: 56 {beats}/min
Rest Nuclear Isotope Dose: 10.4 mCi
SDS: 0
SRS: 4
SSS: 3
ST Depression (mm): 0 mm
Stress Nuclear Isotope Dose: 31.7 mCi
TID: 0.84

## 2022-06-19 MED ORDER — TECHNETIUM TC 99M TETROFOSMIN IV KIT
10.3800 | PACK | Freq: Once | INTRAVENOUS | Status: AC | PRN
Start: 2022-06-19 — End: 2022-06-19
  Administered 2022-06-19: 10.38 via INTRAVENOUS

## 2022-06-19 MED ORDER — REGADENOSON 0.4 MG/5ML IV SOLN
0.4000 mg | Freq: Once | INTRAVENOUS | Status: AC
  Administered 2022-06-19: 0.4 mg via INTRAVENOUS

## 2022-06-19 MED ORDER — TECHNETIUM TC 99M TETROFOSMIN IV KIT
31.6800 | PACK | Freq: Once | INTRAVENOUS | Status: AC | PRN
  Administered 2022-06-19: 31.68 via INTRAVENOUS

## 2022-06-25 NOTE — Addendum Note (Signed)
Addended by: Britt Bottom on: 06/25/2022 11:52 AM   Modules accepted: Orders

## 2022-07-17 ENCOUNTER — Ambulatory Visit: Admit: 2022-07-17 | Discharge: 2022-07-17 | Payer: BLUE CROSS/BLUE SHIELD | Attending: Family

## 2022-07-17 DIAGNOSIS — U071 COVID-19: Secondary | ICD-10-CM

## 2022-07-17 LAB — POC COVID-19 & INFLUENZA COMBO (LIAT IN HOUSE)
INFLUENZA A: NOT DETECTED
INFLUENZA B: NOT DETECTED
SARS-CoV-2: DETECTED — AB

## 2022-07-17 MED ORDER — AZITHROMYCIN 250 MG PO TABS
250 MG | ORAL_TABLET | ORAL | 0 refills | Status: AC
Start: 2022-07-17 — End: 2022-07-22

## 2022-07-17 MED ORDER — PSEUDOEPH-BROMPHEN-DM 30-2-10 MG/5ML PO SYRP
2-30-10 MG/5ML | ORAL | 0 refills | Status: AC
Start: 2022-07-17 — End: 2022-07-22

## 2022-07-17 MED ORDER — PREDNISONE 20 MG PO TABS
20 MG | ORAL_TABLET | Freq: Every day | ORAL | 0 refills | Status: AC
Start: 2022-07-17 — End: 2022-07-22

## 2022-07-17 NOTE — Progress Notes (Signed)
Brett Mcclain (DOB:  [birth year]) is a 66 y.o. male,New patient, here for evaluation of the following chief complaint(s):  Cough, Congestion, Fever, and Generalized Body Aches (Symptoms started yesterday morning)      History of Present Illness:     66 year old male presents with complaints of cough congestion fever body aches starting yesterday morning.  Wife with positive home COVID test over the weekend.  Patient has had COVID in the past.  Associated sinus pressure and congestion.  No past medical history on file.   No past surgical history on file.   Current Outpatient Medications   Medication Sig Dispense Refill    azithromycin (ZITHROMAX) 250 MG tablet Take 1 tablet by mouth See Admin Instructions for 5 days 500mg  on day 1 followed by 250mg  on days 2 - 5 6 tablet 0    predniSONE (DELTASONE) 20 MG tablet Take 2 tablets by mouth daily for 5 days 10 tablet 0    brompheniramine-pseudoephedrine-DM 2-30-10 MG/5ML syrup Take 10 mLs by mouth every 4 hours (while awake) for 5 days 236 mL 0     No current facility-administered medications for this visit.      No Known Allergies    Vitals:    07/17/22 1034   BP: (!) 140/80   Site: Right Upper Arm   Position: Sitting   Cuff Size: Large Adult   Pulse: 74   Resp: 20   Temp: 99.1 F (37.3 C)   TempSrc: Oral   SpO2: 99%   Weight: 201 lb (91.2 kg)   Height: 6' (1.829 m)         Physical Exam:  Physical Exam  Vitals and nursing note reviewed.   Constitutional:       Appearance: Normal appearance.   HENT:      Head: Normocephalic and atraumatic.      Right Ear: Tympanic membrane and ear canal normal.      Left Ear: Tympanic membrane and ear canal normal.      Nose: Congestion present.      Right Sinus: Maxillary sinus tenderness and frontal sinus tenderness present.      Left Sinus: Maxillary sinus tenderness and frontal sinus tenderness present.      Mouth/Throat:      Pharynx: Pharyngeal swelling and posterior oropharyngeal erythema present.   Cardiovascular:      Rate and  Rhythm: Normal rate and regular rhythm.   Pulmonary:      Effort: Pulmonary effort is normal.   Musculoskeletal:         General: Normal range of motion.   Skin:     General: Skin is warm and dry.   Neurological:      Mental Status: He is alert and oriented to person, place, and time. Mental status is at baseline.              ASSESSMENT/PLAN:    ICD-10-CM    1. COVID-19  U07.1       2. Respiratory tract infection  J98.8 azithromycin (ZITHROMAX) 250 MG tablet     predniSONE (DELTASONE) 20 MG tablet      3. Fever, unspecified fever cause  R50.9 POC COVID-19 & Influenza Combo ([place] in House)      4. Acute cough  R05.1 brompheniramine-pseudoephedrine-DM 2-30-10 MG/5ML syrup           1. COVID-19  2. Respiratory tract infection  -     azithromycin (ZITHROMAX) 250 MG tablet; Take 1 tablet by mouth See Admin  Instructions for 5 days 500mg  on day 1 followed by 250mg  on days 2 - 5, Disp-6 tablet, R-0Normal  -     predniSONE (DELTASONE) 20 MG tablet; Take 2 tablets by mouth daily for 5 days, Disp-10 tablet, R-0Normal  3. Fever, unspecified fever cause  -     POC COVID-19 & Influenza Combo ([place] in House)  4. Acute cough  -     brompheniramine-pseudoephedrine-DM 2-30-10 MG/5ML syrup; Take 10 mLs by mouth every 4 hours (while awake) for 5 days, Disp-236 mL, R-0Normal      Visit Diagnoses         Codes    COVID-19    -  Primary U07.1    Respiratory tract infection     J98.8    Fever, unspecified fever cause     R50.9    Acute cough     R05.1             Results for orders placed or performed in visit on 07/17/22   POC COVID-19 & Influenza Combo ([place] in House)   Result Value Ref Range    SARS-CoV-2 Detected (A) Not Detected    INFLUENZA A Not Detected Not Detected    INFLUENZA B Not Detected Not Detected    Narrative    Is this test for diagnosis or screening?->Diagnosis of ill patient  Symptomatic for COVID-19 as defined by CDC?->Yes  Date of Symptom Onset->07/15/22  Hospitalized for COVID-19?->No  Admitted to ICU for  COVID-19?->No  Employed in healthcare setting?->Unknown  Resident in a congregate (group) care setting?->Unknown  Previously tested for COVID-19?->Unknown  Pregnant:->No              Testing is positive for COVID.  Negative for influenza.  Patient is asking about Paxlovid, does take statin based medications for cholesterol.  He is advised to talk to his primary care physician in regards to Paxlovid.  I have offered to treat respiratory infection with Z-Pak prednisone and Bromfed.  Patient advised to return if symptoms worsen or remain unresolved.      Return if symptoms worsen or fail to improve.      Electronically signed by:  --07/19/22, APRN - NP

## 2022-07-18 ENCOUNTER — Ambulatory Visit: Payer: BC Managed Care – PPO | Admitting: Physician Assistant

## 2022-07-24 ENCOUNTER — Other Ambulatory Visit: Payer: Self-pay | Admitting: Internal Medicine

## 2022-07-24 DIAGNOSIS — F324 Major depressive disorder, single episode, in partial remission: Secondary | ICD-10-CM

## 2022-07-24 NOTE — Telephone Encounter (Signed)
rx was sent to pharmacy on 12/17/21 #90/3.   Requested Prescriptions  Pending Prescriptions Disp Refills  . escitalopram (LEXAPRO) 10 MG tablet 90 tablet 3    Sig: Take 1 tablet (10 mg total) by mouth daily.     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

## 2022-07-24 NOTE — Telephone Encounter (Signed)
Medication Refill - Medication: escitalopram (LEXAPRO) 10 MG tablet  Has the patient contacted their pharmacy? Yes.   (Agent: If no, request that the patient contact the pharmacy for the refill. If patient does not wish to contact the pharmacy document the reason why and proceed with request.) (Agent: If yes, when and what did the pharmacy advise?)  Preferred Pharmacy (with phone number or street name):  [facility], [place] [facility] [employer] [facility] Phone:  [phone]  Fax:  [phone]     Has the patient been seen for an appointment in the last year OR does the patient have an upcoming appointment? Yes.    Agent: Please be advised that RX refills may take up to 3 business days. We ask that you follow-up with your pharmacy.

## 2022-07-27 ENCOUNTER — Ambulatory Visit: Admit: 2022-07-27 | Discharge: 2022-07-27 | Payer: BLUE CROSS/BLUE SHIELD | Attending: Medical

## 2022-07-27 DIAGNOSIS — H6993 Unspecified Eustachian tube disorder, bilateral: Secondary | ICD-10-CM

## 2022-07-27 NOTE — Progress Notes (Signed)
Brett Mcclain (DOB:  [birth year]) is a 66 y.o. male, here for evaluation of the following chief complaint(s):  Sinusitis (Had COVID two weeks ago. Just started feeling bad again. Head stopped up and ears popping. Has been taking mucinex for the symptoms)    Review of Systems   Constitutional:  Positive for fatigue.   HENT:  Positive for postnasal drip and rhinorrhea.    All other systems reviewed and are negative.      History of Present Illness:   66yo male presents with head congestion, post-nasal drainage, fatigue, ear popping - symptoms started several days ago. He was seen here 9/20, (+) covid test, provided zpack, prednisone x5 days. States that those symptoms improved initially until a few days ago. He denies HA, ear pain, cough, SOB, chest tightness, dizziness. Has been using Mucinex Daytime/Nighttime for symptom relief.     Physical Exam  Vitals and nursing note reviewed.   Constitutional:       Appearance: Normal appearance.   HENT:      Head: Normocephalic and atraumatic.      Right Ear: Tympanic membrane is retracted.      Left Ear: Tympanic membrane is retracted.      Mouth/Throat:      Comments: Increased clear drainage posteriorly  Eyes:      Extraocular Movements: Extraocular movements intact.   Cardiovascular:      Rate and Rhythm: Normal rate and regular rhythm.   Pulmonary:      Effort: Pulmonary effort is normal.      Breath sounds: Normal breath sounds.   Musculoskeletal:         General: Normal range of motion.      Cervical back: Normal range of motion.   Skin:     General: Skin is warm and dry.   Neurological:      General: No focal deficit present.      Mental Status: He is alert and oriented to person, place, and time.   Psychiatric:         Mood and Affect: Mood normal.         Behavior: Behavior normal.         ASSESSMENT/PLAN:  Pt with head congestion, post-nasal drainage, fatigue, bilateral ETD - advised likely post-viral/post-covid symptoms, also possible viral vs allergy mediated  symptoms. Recommend increasing water/fluids, Allegra and Flonase provided for post-nasal drainage and head congestion. Advised that post-covid fatigue can last for several weeks. Follow-up with PCP or return here if symptoms persist or worsen.     No follow-ups on file.     Allergies   Allergen Reactions    Icosapent Ethyl Diarrhea     Worsening diarrhea         Vitals:    07/27/22 1054   BP: (!) 144/88   Pulse: 65   Resp: 17   Temp: 98.8 F (37.1 C)   SpO2: 99%      No results found for this visit on 07/27/22.  No results found for any visits on 07/27/22.   Visit Diagnoses         Codes    ETD (Eustachian tube dysfunction), bilateral    -  Primary H69.83    Head congestion     R09.81    Post-nasal drainage     R09.82    Viral URI     J06.9           No problem-specific Assessment & Plan notes found for this encounter.  Electronically signed by:  --Brunilda Payor, PA

## 2022-08-02 ENCOUNTER — Telehealth: Payer: Self-pay | Admitting: Internal Medicine

## 2022-08-02 DIAGNOSIS — F324 Major depressive disorder, single episode, in partial remission: Secondary | ICD-10-CM

## 2022-08-02 NOTE — Telephone Encounter (Signed)
Formatting of this note might be different from the original.  Medication Refill - Medication: escitalopram (LEXAPRO) 10 MG tablet    Has the patient contacted their pharmacy? yes  (Agent: If no, request that the patient contact the pharmacy for the refill. If patient does not wish to contact the pharmacy document the reason why and proceed with request.)  (Agent: If yes, when and what did the pharmacy advise?)pharmacy called in directly    Preferred Pharm[facility] (with phone number [facility] [address] [place] [address] phone [phone]  Doesn't have fx number  Has the patient been seen for an appointment in the last year OR does the patient have an upcoming appointment? yes    Agent: Please be advised that RX refills may take up to 3 business days. We ask that you follow-up with your pharmacy.   Electronically signed by Bayard Beaver at 08/02/2022  9:57 AM EDT

## 2022-08-02 NOTE — Telephone Encounter (Signed)
Formatting of this note might be different from the original.  Pt called, advised him that I reached out to [place] and they are stating his account is inactive and he would have to call to set that back up. He states that he has new employer with [employer] and they [facility] the [facility]. I advised him I would reach back out and call him back to give update. Called [facility] and spoke with Jess, Agent. She states that they are unable to set up account and pt would have to call insurance since they send that info to them and then account gets activated that way. I called pt back advised him of this info and advised him to reach out to insurance once more and if still continues to have issues can just send medication to local pharmacy if needed. Pt verbalized understanding.   Electronically signed by Erie Noe, RN at 08/02/2022  1:52 PM EDT

## 2022-08-02 NOTE — Telephone Encounter (Signed)
Pt called, advised him that I reached out to [place] and they are stating his account is inactive and he would have to call to set that back up. He states that he has new employer with [employer] and they use the [facility]. I advised him I would reach back out and call him back to give update. Called [facility] and spoke with Jess, Agent. She states that they are unable to set up account and pt would have to call insurance since they send that info to them and then account gets activated that way. I called pt back advised him of this info and advised him to reach out to insurance once more and if still continues to have issues can just send medication to local pharmacy if needed. Pt verbalized understanding.

## 2022-08-02 NOTE — Telephone Encounter (Signed)
Medication Refill - Medication: escitalopram (LEXAPRO) 10 MG tablet   Has the patient contacted their pharmacy? yes (Agent: If no, request that the patient contact the pharmacy for the refill. If patient does not wish to contact the pharmacy document the reason why and proceed with request.) (Agent: If yes, when and what did the pharmacy advise?)pharmacy called in directly  Preferred Pharmacy (with phone number or [facility] [address] [place] [address] phone [phone] Doesn't have fx number Has the patient been seen for an appointment in the last year OR does the patient have an upcoming appointment? yes  Agent: Please be advised that RX refills may take up to 3 business days. We ask that you follow-up with your pharmacy.

## 2022-08-06 ENCOUNTER — Other Ambulatory Visit: Payer: Self-pay

## 2022-08-06 DIAGNOSIS — F324 Major depressive disorder, single episode, in partial remission: Secondary | ICD-10-CM

## 2022-08-06 MED ORDER — ESCITALOPRAM OXALATE 10 MG PO TABS: 10.0000 mg | ORAL_TABLET | Freq: Every day | ORAL | 0 refills | Status: AC

## 2022-08-06 NOTE — Telephone Encounter (Signed)
Formatting of this note might be different from the original.  Patient called in still hasnt gotten a resolve, trying to get med lexapro. He says he spoke with insurance and they sent him back here. Please call back for further assistance.  Electronically signed by Bayard Beaver at 08/06/2022 10:15 AM EDT

## 2022-08-06 NOTE — Telephone Encounter (Signed)
Patient called in still hasnt gotten a resolve, trying to get med lexapro. He says he spoke with insurance and they sent him back here. Please call back for further assistance.

## 2022-09-03 ENCOUNTER — Other Ambulatory Visit: Payer: Self-pay | Admitting: Internal Medicine

## 2022-09-03 DIAGNOSIS — F324 Major depressive disorder, single episode, in partial remission: Secondary | ICD-10-CM

## 2022-09-03 NOTE — Telephone Encounter (Signed)
Formatting of this note is different from the original.  Requested medications are due for refill today.  no    Requested medications are on the active medications list.  yes    Last refill. 08/06/2022 #90    Future visit scheduled.   no    Notes to clinic.  Pharmacy comment: REQUEST FOR 90 DAYS PRESCRIPTION. DX Code Needed.     Requested Prescriptions   Pending Prescriptions Disp Refills    escitalopram (LEXAPRO) 10 MG tablet [Pharmacy Med Name: ESCITALOPRAM 10 MG TABLET] 90 tablet 1     Sig: TAKE 1 TABLET BY MOUTH EVERY DAY      Psychiatry:  Antidepressants - SSRI Failed - 09/03/2022 11:33 AM       Failed - Valid encounter within last 6 months     Recent Outpatient Visits         8 months ago Annual physical exam    [facility], Jesse Sans, MD    1 year ago Annual physical exam    [facility] at [facility], Jesse Sans, MD    2 years ago Annual physical exam    [facility] at [facility], Jesse Sans, MD    3 years ago Arthritis of hand    [facility] at [facility], Jesse Sans, MD    3 years ago Need for shingles vaccine    [facility] at [facility], Jesse Sans, MD                 Passed - Completed PHQ-2 or PHQ-9 in the last 360 days         Electronically signed by Cheral Almas, RN at 09/03/2022  2:56 PM EST

## 2022-09-03 NOTE — Telephone Encounter (Signed)
Requested medications are due for refill today.  no  Requested medications are on the active medications list.  yes  Last refill. 08/06/2022 #90  Future visit scheduled.   no  Notes to clinic.  Pharmacy comment: REQUEST FOR 90 DAYS PRESCRIPTION. DX Code Needed.     Requested Prescriptions  Pending Prescriptions Disp Refills   escitalopram (LEXAPRO) 10 MG tablet [Pharmacy Med Name: ESCITALOPRAM 10 MG TABLET] 90 tablet 1    Sig: TAKE 1 TABLET BY MOUTH EVERY DAY     Psychiatry:  Antidepressants - SSRI Failed - 09/03/2022 11:33 AM      Failed - Valid encounter within last 6 months    Recent Outpatient Visits           8 months ago Annual physical exam   [facility], Jesse Sans, MD   1 year ago Annual physical exam   [facility] at [facility], Jesse Sans, MD   2 years ago Annual physical exam   [facility] at [facility], Jesse Sans, MD   3 years ago Arthritis of hand   [facility] at [facility], Jesse Sans, MD   3 years ago Need for shingles vaccine   [facility] at [facility], Jesse Sans, MD              Passed - Completed PHQ-2 or PHQ-9 in the last 360 days

## 2022-09-09 ENCOUNTER — Other Ambulatory Visit: Payer: Self-pay | Admitting: Medical

## 2022-10-04 ENCOUNTER — Encounter: Admit: 2022-10-04 | Discharge: 2022-10-04 | Payer: BLUE CROSS/BLUE SHIELD

## 2022-10-04 DIAGNOSIS — Z125 Encounter for screening for malignant neoplasm of prostate: Secondary | ICD-10-CM

## 2022-10-04 MED ORDER — ROSUVASTATIN CALCIUM 20 MG PO TABS
20 MG | ORAL_TABLET | Freq: Every day | ORAL | 0 refills | Status: AC
Start: 2022-10-04 — End: 2022-12-30

## 2022-10-04 MED ORDER — ESCITALOPRAM OXALATE 10 MG PO TABS
10 MG | ORAL_TABLET | Freq: Every day | ORAL | 3 refills | Status: AC
Start: 2022-10-04 — End: 2023-02-11

## 2022-10-04 MED ORDER — FENOFIBRATE 145 MG PO TABS
145 MG | ORAL_TABLET | Freq: Every day | ORAL | 0 refills | Status: DC
Start: 2022-10-04 — End: 2022-12-30

## 2022-10-04 MED ORDER — DIPHENOXYLATE-ATROPINE 2.5-0.025 MG PO TABS
ORAL_TABLET | Freq: Four times a day (QID) | ORAL | 1 refills | Status: AC | PRN
Start: 2022-10-04 — End: 2023-01-02

## 2022-10-04 NOTE — Progress Notes (Signed)
Brett Mcclain (DOB:  08-16-1956) is a 66 y.o. male here for evaluation of the following chief complaint(s):  New Patient (H/O Hyperlipidemia, Anxiety, heart disease, IBS, Arthritis) and Health Maintenance (Labs done 01/2022. Colonoscopy 2021.)    Assessment and Plan   1. Prostate cancer screening  -     PSA Screening; Future  2. Coronary artery disease involving native coronary artery of native heart without angina pectoris  Comments:  continue statin and fibrate. BP wnl. No c/o angina. Referral to cardiology to est care. Lipid ordered, pt will get done beginning of new yr.   Orders:  -     External Referral to Cardiology  -     Lipid Panel; Future  -     Comprehensive Metabolic Panel; Future  3. S/P drug eluting coronary stent placement  Comments:  cardiology referral to est  Orders:  -     Lipid Panel; Future  4. Mixed hyperlipidemia  Comments:  lipid ordered. continue rosuvastatin. Diet/exercise discussed.   Orders:  -     Lipid Panel; Future  -     Comprehensive Metabolic Panel; Future  -     diphenoxylate-atropine (LOMOTIL) 2.5-0.025 MG per tablet; Take 1 tablet by mouth 4 times daily as needed for Diarrhea for up to 90 days. Max Daily Amount: 4 tablets, Disp-90 tablet, R-1Normal  5. Hypertriglyceridemia  Comments:  advised trying to cut back on alcohol along with keeping up on exercise and healthy diet like mediterranean diet.   Orders:  -     Lipid Panel; Future  6. Recurrent major depressive disorder, in remission (HCC)  Comments:  continue lexapro  7. Gastroesophageal reflux disease without esophagitis  Comments:  PRN prilosec, pt gets OTC  8. Diastolic dysfunction without heart failure  Comments:  no evidence of HF on exam today  9. Gout of ankle, unspecified cause, unspecified chronicity, unspecified laterality  Comments:  uric acid level check, pt declines medication at current time and if level elevated just wants a baseline not to initiate. He will notify me if having issues  Orders:  -     Uric Acid;  Future  10. Arthritis of carpometacarpal [facility]) joint of left thumb  Comments:  Pt desires referral for steroid injection possibly, ortho referral created  Orders:  -     Amb External Referral To Orthopedic Surgery    Return in about 1 year (around 10/05/2023) for annual exam.  Subjective   History    66 year old male here to review medical conditions, medications, and needs new PCP.     PMH:  Depression in remission  Anxiety, mild  CAD  PVCs  Status post PCI/DES to the mid and distal RCA 05/2013 (in setting of stable angina)  Hyperlipidemia  Hypertriglyceridemia  Nuclear stress test 05/2022; pt reports that the stress was normal  Echocardiogram 06/18/22: grade 1 diastolic dysfunction, LVEF 60-65%  Left de quervain's tenosnovitis  IBS diarrhea predominant  Left CMC arthritisl    Past Surgical History:   Procedure Laterality Date   CARDIAC CATHETERIZATION 06/22/2013   COLONOSCOPY WITH PROPOFOL N/A 09/01/2018   Procedure: COLONOSCOPY WITH PROPOFOL; Surgeon: Midge Minium, MD; Location: [facility] ENDOSCOPY; Service: Endoscopy; Laterality: N/A;   CORONARY ANGIOPLASTY WITH STENT PLACEMENT 06/22/2013   stent placement in the distal RCA & mid RCA   INCISION AND DRAINAGE PERIRECTAL ABSCESS N/A 04/01/2016   Procedure: IRRIGATION AND DEBRIDEMENT PERIRECTAL ABSCESS; Surgeon: Kieth Brightly, MD; Location: [facility] ORS; Service: General; Laterality: N/A;   MOHS SURGERY  stomach area       Subjective:  For depression patient is taking Lexapro 10 mg daily. Years back had situation that caused depression. The lexapro has kept anxiety down.     For cardiology the patient follows with Dr.Arida. He is on a statin, tricor, and also ASA 81 mg. Not on any BP medication.     Was told to take magnesium for his  PVC and he tolerates this.     He takes omeprazole for GERD but is more PRN.     For HLD and hypertriglyceridemia he takes rosuvastatin and tricor.     He quit taking celebrex and takes one alleve per day, has arthritis in left [facility] joint  that is bothersome.Was told to lay off a little bit because liver enzymes were elevated. No nausea or vomitting after eating. No abdominial pain.     He does take lomotil at night for diarrhea predominant IBS. Also takes fiber.       Health maintenance:   Colon cancer screening: 2021 reports   PSA: no family history, ordered  Lung cancer screen if applicable; never   Lipid; ordered    Vaccinations:   Tdap: 06/9020  Pneumococcal: prevnar 43 pt states had recently  COVID-53: last in 2021  Shingrix: completed 2020  Influenza: he is waiting until after this weekend    Social:   Tobacco: never smoker  Alcohol: couple of drinks per night, 1-2 on average on weekends sometimes more  Employment: Works at [employer]  Relationship status: married  Exercise; active in life but has started going back to the [employer]   Diet: trying to lose weight currently, avoids fast food and burgers and fries, red meat not more than 2-3 X p er week.     Objective   Physical Exam  Vitals reviewed.   HENT:      Head: Normocephalic.   Eyes:      Extraocular Movements: Extraocular movements intact.   Cardiovascular:      Rate and Rhythm: Normal rate and regular rhythm.      Heart sounds: No murmur heard.  Pulmonary:      Effort: Pulmonary effort is normal. No respiratory distress.      Breath sounds: Normal breath sounds. No wheezing.   Skin:     General: Skin is warm and dry.   Neurological:      General: No focal deficit present.      Mental Status: He is alert.   Psychiatric:         Mood and Affect: Mood normal.       No results found for any visits on 10/04/22.      An electronic signature was used to authenticate this note.    -Reuven Braver Cloyde Reams, MD    This document was created using voice transcription software. Mistakes are possible. Please contact Pryor Curia of this note if there are any concerns or if clarification is needed regarding the wording in this document.

## 2022-10-16 ENCOUNTER — Ambulatory Visit: Admit: 2022-10-16 | Discharge: 2022-10-16 | Payer: BLUE CROSS/BLUE SHIELD

## 2022-10-16 ENCOUNTER — Encounter: Admit: 2022-10-16 | Discharge: 2022-10-16 | Payer: BLUE CROSS/BLUE SHIELD | Attending: Orthopaedic Surgery

## 2022-10-16 DIAGNOSIS — M1812 Unilateral primary osteoarthritis of first carpometacarpal joint, left hand: Secondary | ICD-10-CM

## 2022-10-16 MED ORDER — LIDOCAINE HCL 1 % IJ SOLN
1 % | Freq: Once | INTRAMUSCULAR | Status: AC
Start: 2022-10-16 — End: 2022-10-16
  Administered 2022-10-16: 14:00:00 0.5 mL via INTRADERMAL

## 2022-10-16 MED ORDER — TRIAMCINOLONE ACETONIDE 10 MG/ML IJ SUSP
10 MG/ML | Freq: Once | INTRAMUSCULAR | Status: AC
Start: 2022-10-16 — End: 2022-10-16
  Administered 2022-10-16: 14:00:00 5 mg via INTRA_ARTICULAR

## 2022-10-16 NOTE — Progress Notes (Signed)
51/88/4166   Brett Mcclain is 66 y.o. male with complaint of basilar left thumb pain.  Patient states symptoms have been present for at least a year.  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  Pain with pressure and gripping and holding.  No complaints of triggering or catching or numbness or tingling.    Review of systems and Past Medical History as documented above.    Physical examination:   General appearance: Well-nourished, well-groomed  Mental status: Normal mood and affect  Oriented to time place and person  Normal gait pattern  Respiratory: Normal respiratory effort and movements, no audible wheezing  Cardiac: Regular rate and rhythm    Examination left wrist shows tenderness palpation first CMC joint.  No tenderness first dorsal compartment.  Negative patient perform Wynn Maudlin.  Positive grind test.  All digits are warm and well-perfused.  Intact flexion extension without triggering or catching.    Indication: Left thumb pain  Comparison: None  Technique: PA oblique lateral and Roberts views left wrist  Findings: 5 views of left wrist demonstrate decreased first CMC joint space with osteophyte formation and slight subluxation.  Impression: Moderately severe left first CMC arthritis    Images and findings reviewed with patient.    Impression: Left first Carpometacarpal Arthritis. I reviewed the pathophysiology of first [place] arthritis along with treatment options and expected course. I explained that the majority of patients can be managed successfully with non-operative measures. Typically this involves use of a supportive brace and some activity modifications. Over the counter medications can be of benefit as well.  Oral and topical options reviewed including: Tylenol, NSAIDS, Glucosamine, chondroiten sulfate, and Voltaren gel.  For those with continued symptoms a corticosteroid injection can be performed. For those that continue to have discomfort and  decreased function operative treatment is available. First [facility] arthroplasty with or without ligament reconstruction was discussed with description of the operative procedure, benefits and risks, and expected post-operative course and responsibilities.    Patient would like to proceed with steroid injection today.    Risks and Benefits of the procedure discussed as well as expected rehabilitative course. Risk of skin depigmentation reviewed.  I also explained the possible temporary elevation in blood glucose levels with steroid injections in people with diabetes.  A patient may need to adjust their diabetic medications.   Sterile injection of 5 mg kenalog with lidocaine with 27 gauge needle and ethyl chloride spray was performed into the volar aspect left first CMC joint.   Patient tolerated the procedure well and will follow up in 4 - 6 weeks.          Please note that this note was generated using voice recognition [employer].  Although every effort was made to ensure the accuracy of this automated transcription, some errors in transcription may have occurred.

## 2022-11-05 ENCOUNTER — Other Ambulatory Visit: Payer: Self-pay | Admitting: Internal Medicine

## 2022-11-05 DIAGNOSIS — F324 Major depressive disorder, single episode, in partial remission: Secondary | ICD-10-CM

## 2022-12-18 ENCOUNTER — Encounter: Payer: No Typology Code available for payment source | Admitting: Internal Medicine

## 2022-12-20 NOTE — Progress Notes (Signed)
Patient reported colonoscopy

## 2022-12-24 ENCOUNTER — Telehealth

## 2022-12-24 NOTE — Telephone Encounter (Signed)
Rx pending for provider review

## 2022-12-24 NOTE — Telephone Encounter (Signed)
Needs med refill for:    rosuvastatin (CRESTOR) 20 MG tablet taking 1 tablet once daily with 90 day supply    Also patient is requesting lovastatin which is not on med list     No med changes

## 2022-12-24 NOTE — Telephone Encounter (Signed)
New pt to office, protocol not met, defer to MD.  Per protocol criteria not met. Labs  > 12 months. Sent to practice for provider discretion.

## 2022-12-26 NOTE — Telephone Encounter (Signed)
Patient called, left VM

## 2022-12-27 ENCOUNTER — Encounter

## 2022-12-27 LAB — LIPID PANEL
Chol/HDL Ratio: 3.7 (ref 0.0–4.4)
Cholesterol: 158 mg/dL (ref 100–200)
HDL: 43 mg/dL (ref >=40)
LDL Cholesterol: 75.2 mg/dL (ref 0.0–100.0)
LDL/HDL Ratio: 1.7
Triglycerides: 199 mg/dL — ABNORMAL HIGH (ref 0–149)
VLDL: 39.8 mg/dL (ref 5.0–40.0)

## 2022-12-27 LAB — COMPREHENSIVE METABOLIC PANEL
ALT: 20 U/L (ref 0–50)
AST: 26 U/L (ref 0–50)
Albumin/Globulin Ratio: 2.1 (ref 1.00–2.70)
Albumin: 4.7 g/dL (ref 3.5–5.2)
Alk Phosphatase: 60 U/L (ref 40–130)
Anion Gap: 10 mmol/L (ref 2–17)
BUN: 19 mg/dL (ref 8–23)
CO2: 28 mmol/L (ref 22–29)
Calcium: 10 mg/dL (ref 8.8–10.2)
Chloride: 101 mmol/L (ref 98–107)
Creatinine: 1.2 mg/dL (ref 0.7–1.3)
Est, Glom Filt Rate: 68 mL/min/1.73m (ref >=60)
Globulin: 2.2 g/dL (ref 1.9–4.4)
Glucose: 98 mg/dL (ref 70–99)
OSMOLALITY CALCULATED: 280 mOsm/kg (ref 270–287)
Potassium: 4.7 mmol/L (ref 3.5–5.3)
Sodium: 139 mmol/L (ref 135–145)
Total Bilirubin: 0.36 mg/dL (ref 0.00–1.20)
Total Protein: 6.9 g/dL (ref 6.4–8.3)

## 2022-12-27 LAB — PSA SCREENING: Screening PSA: 0.709 ng/mL (ref 0.000–4.000)

## 2022-12-27 LAB — URIC ACID: Uric Acid: 6.9 mg/dL (ref 3.4–7.0)

## 2022-12-30 MED ORDER — FENOFIBRATE 145 MG PO TABS
145 | ORAL_TABLET | Freq: Every day | ORAL | 3 refills | Status: AC
Start: 2022-12-30 — End: ?

## 2022-12-30 MED ORDER — ROSUVASTATIN CALCIUM 20 MG PO TABS
20 MG | ORAL_TABLET | Freq: Every day | ORAL | 3 refills | Status: DC
Start: 2022-12-30 — End: 2023-12-25

## 2022-12-30 NOTE — Telephone Encounter (Signed)
Patient has completed labs.

## 2022-12-30 NOTE — Telephone Encounter (Signed)
Pt called and left VM

## 2023-01-01 NOTE — Telephone Encounter (Signed)
Patient is returning missed call.    Please assist

## 2023-01-01 NOTE — Telephone Encounter (Signed)
Would like call back to discuss lab results

## 2023-01-01 NOTE — Telephone Encounter (Signed)
Called patient, left VM

## 2023-01-02 NOTE — Telephone Encounter (Signed)
Patient has been called and informed

## 2023-01-29 ENCOUNTER — Ambulatory Visit: Admit: 2023-01-29 | Discharge: 2023-01-29 | Payer: BLUE CROSS/BLUE SHIELD | Attending: Orthopaedic Surgery

## 2023-01-29 DIAGNOSIS — M1812 Unilateral primary osteoarthritis of first carpometacarpal joint, left hand: Secondary | ICD-10-CM

## 2023-01-29 MED ORDER — LIDOCAINE HCL 1 % IJ SOLN
1 | Freq: Once | INTRAMUSCULAR | Status: AC
Start: 2023-01-29 — End: 2023-01-29
  Administered 2023-01-29: 20:00:00 0.5 mL via INTRADERMAL

## 2023-01-29 MED ORDER — TRIAMCINOLONE ACETONIDE 10 MG/ML IJ SUSP
10 | Freq: Once | INTRAMUSCULAR | Status: AC
Start: 2023-01-29 — End: 2023-01-29
  Administered 2023-01-29: 20:00:00 5 mg via INTRA_ARTICULAR

## 2023-01-29 NOTE — Progress Notes (Signed)
Brett Mcclain  is status post steroid injection for left first CMC arthritis on 10/16/2022.  Patient states that the injection gave good relief of symptoms only returning about 3 weeks ago.  Patient has been more active with the warmer weather.    Examination left hand shows tenderness to palpation left first CMC joint.  No tenderness over first dorsal compartment.  Positive grind test.    Previous radiographs demonstrated moderately severe left first CMC arthritis.    Impression: Left first CMC arthritis.  I provided the patient with the brochure regarding the diagnosis and treatment options and indications.  He would like to proceed with repeat steroid injection today.    Risks and Benefits of the procedure discussed as well as expected rehabilitative course. Risk of skin depigmentation reviewed.  I also explained the possible temporary elevation in blood glucose levels with steroid injections in people with diabetes.  A patient may need to adjust their diabetic medications.   Sterile injection of 5 mg kenalog with lidocaine with 27 gauge needle and ethyl chloride spray was performed into the volar aspect left first CMC joint.   Patient tolerated the procedure well and will follow up in 4 - 6 weeks.

## 2023-02-11 ENCOUNTER — Telehealth

## 2023-02-11 MED ORDER — ESCITALOPRAM OXALATE 10 MG PO TABS
10 | ORAL_TABLET | Freq: Every day | ORAL | 2 refills | Status: AC
Start: 2023-02-11 — End: ?

## 2023-02-11 NOTE — Telephone Encounter (Signed)
----   escitalopram (LEXAPRO) 10 MG tablet  1x day 90DS 3 refills    Send to [facility]    Npo med changes    LV: 10/04/22  NV: 10/07/23

## 2023-02-11 NOTE — Telephone Encounter (Signed)
Order has medication warning when attempting to fill requiring override reason. Per protocol/nurse discretion sent to practice for provider discretion/refill.

## 2023-03-12 ENCOUNTER — Encounter: Admit: 2023-03-12 | Discharge: 2023-03-12 | Payer: BLUE CROSS/BLUE SHIELD | Attending: Orthopaedic Surgery

## 2023-03-12 DIAGNOSIS — M1812 Unilateral primary osteoarthritis of first carpometacarpal joint, left hand: Secondary | ICD-10-CM

## 2023-03-12 MED ORDER — TRIAMCINOLONE ACETONIDE 10 MG/ML IJ SUSP
10 | Freq: Once | INTRAMUSCULAR | Status: AC
Start: 2023-03-12 — End: 2023-03-13
  Administered 2023-03-13: 19:00:00 5 mg via INTRA_ARTICULAR

## 2023-03-12 MED ORDER — LIDOCAINE HCL 1 % IJ SOLN
1 | Freq: Once | INTRAMUSCULAR | Status: AC
Start: 2023-03-12 — End: 2023-03-13
  Administered 2023-03-13: 19:00:00 0.5 mL via INTRADERMAL

## 2023-03-12 NOTE — Progress Notes (Signed)
Brett Mcclain  is status post steroid injection for left first CMC arthritis on 10/16/2022 and 01/29/2023.  Noted not as much improvement with the second injection.  He is still using the MetaGrip brace.  No complaints of numbness or tingling.    Examination left wrist shows tenderness over volar aspect of first CMC joint.  No tenderness over first dorsal compartment.  Negative CMC grind test.  Intact flexion extension all digits.  Sensibility intact light touch.    Previous radiographs demonstrated moderately severe left first CMC arthritis.    Impression: Left first CMC arthritis.  I reviewed with the patient treatment options and he would like to proceed with the third steroid injection.    Risks and Benefits of the procedure discussed as well as expected rehabilitative course. Risk of skin depigmentation reviewed.  I also explained the possible temporary elevation in blood glucose levels with steroid injections in people with diabetes.  A patient may need to adjust their diabetic medications.   Sterile injection of 5 mg kenalog with lidocaine with 27 gauge needle and ethyl chloride spray was performed into the volar aspect left first CMC joint.   Patient tolerated the procedure well and will follow up in 4 - 6 weeks.

## 2023-03-31 ENCOUNTER — Ambulatory Visit: Admit: 2023-03-31 | Discharge: 2023-03-31 | Payer: BLUE CROSS/BLUE SHIELD | Attending: Family Medicine

## 2023-03-31 DIAGNOSIS — N410 Acute prostatitis: Secondary | ICD-10-CM

## 2023-03-31 LAB — AMB POC URINALYSIS DIP STICK AUTO W/O MICRO
Bilirubin, Urine, POC: NEGATIVE
Blood (UA POC): NEGATIVE
Glucose, Urine, POC: NEGATIVE
Ketones, Urine, POC: NEGATIVE
Leukocyte Esterase, Urine, POC: NEGATIVE
Nitrite, Urine, POC: NEGATIVE
Protein, Urine, POC: NEGATIVE
Specific Gravity, Urine, POC: 1.025 (ref 1.001–1.035)
Urobilinogen, POC: 0.2
pH, Urine, POC: 6 (ref 4.6–8.0)

## 2023-03-31 MED ORDER — DOXYCYCLINE HYCLATE 100 MG PO TABS
100 | ORAL_TABLET | Freq: Two times a day (BID) | ORAL | 0 refills | Status: AC
Start: 2023-03-31 — End: 2023-04-07

## 2023-03-31 MED ORDER — TAMSULOSIN HCL 0.4 MG PO CAPS
0.4 | ORAL_CAPSULE | Freq: Every day | ORAL | 5 refills | Status: AC
Start: 2023-03-31 — End: ?

## 2023-03-31 NOTE — Progress Notes (Signed)
CHIEF COMPLAINT:  Chief Complaint   Patient presents with    Other     Painful Urination        HISTORY OF PRESENT ILLNESS:  Mr. Cousin is a 67 y.o. male  who presents to clinic due to having pain upon urinating since this past Friday. Patient states his wife is a retired Publishing rights manager and gave patient some Doxycycline. He has taking 3 doses thus far and states the pain has resolved.  Pain was deep in his pelvis.  Patient states prior to the pain, he has noticed his urine stream not being as strong and states he wakes up 2-3 times at night having to urinate. He also states he does not feel as if he is emptying his bladder fully.           PHQ:      03/31/2023     4:19 PM   PHQ-9    Little interest or pleasure in doing things 0   Feeling down, depressed, or hopeless 0   Trouble falling or staying asleep, or sleeping too much 0   Feeling tired or having little energy 0   Poor appetite or overeating 0   Feeling bad about yourself - or that you are a failure or have let yourself or your family down 0   Trouble concentrating on things, such as reading the newspaper or watching television 0   Moving or speaking so slowly that other people could have noticed. Or the opposite - being so fidgety or restless that you have been moving around a lot more than usual 0   Thoughts that you would be better off dead, or of hurting yourself in some way 0   PHQ-2 Score 0   PHQ-9 Total Score 0   If you checked off any problems, how difficult have these problems made it for you to do your work, take care of things at home, or get along with other people? 0       CURRENT MEDICATION LIST:    Current Outpatient Medications   Medication Sig Dispense Refill    tamsulosin (FLOMAX) 0.4 MG capsule Take 1 capsule by mouth daily 30 capsule 5    doxycycline hyclate (VIBRA-TABS) 100 MG tablet Take 1 tablet by mouth 2 times daily for 7 days 14 tablet 0    escitalopram (LEXAPRO) 10 MG tablet Take 1 tablet by mouth daily 90 tablet 2    rosuvastatin  (CRESTOR) 20 MG tablet Take 1 tablet by mouth daily 90 tablet 3    fenofibrate (TRICOR) 145 MG tablet Take 1 tablet by mouth daily 90 tablet 3    magnesium (MAGNESIUM-OXIDE) 250 MG TABS tablet Take 1 tablet by mouth daily      Naproxen Sodium (ALEVE) 220 MG CAPS Take by mouth      aspirin 81 MG EC tablet Take 1 tablet by mouth daily       No current facility-administered medications for this visit.        ALLERGIES:    Allergies   Allergen Reactions    Icosapent Ethyl Diarrhea     Worsening diarrhea            HISTORY:  Past Medical History:   Diagnosis Date    Anxiety     Congenital heart disease     Hyperlipidemia     Irritable bowel syndrome (IBS)     Osteoarthritis     Skin cancer       Past  Surgical History:   Procedure Laterality Date    CORONARY ANGIOPLASTY WITH STENT PLACEMENT      2    COSMETIC SURGERY      skin cancer removed      Social History     Socioeconomic History    Marital status: Married     Spouse name: Not on file    Number of children: Not on file    Years of education: Not on file    Highest education level: Not on file   Occupational History    Not on file   Tobacco Use    Smoking status: Never    Smokeless tobacco: Never   Substance and Sexual Activity    Alcohol use: Yes     Alcohol/week: 10.0 standard drinks of alcohol     Types: 10 Glasses of wine per week    Drug use: Never    Sexual activity: Yes     Partners: Female   Other Topics Concern    Not on file   Social History Narrative    Not on file     Social Determinants of Health     Financial Resource Strain: Not on file   Food Insecurity: Not on file   Transportation Needs: Not on file   Physical Activity: Not on file   Stress: Not on file   Social Connections: Not on file   Intimate Partner Violence: Not on file   Housing Stability: Not on file      Family History   Problem Relation Age of Onset    Cancer Mother         vaginal    Heart Disease Father     Unknown Maternal Grandmother     Unknown Maternal Grandfather         REVIEW OF  SYSTEMS:  Review of systems is as indicated in HPI otherwise negative.    PHYSICAL EXAM:  Physical Exam  Vitals reviewed.   Constitutional:       General: He is not in acute distress.     Appearance: Normal appearance. He is normal weight.   HENT:      Head: Normocephalic.      Right Ear: Ear canal normal.      Left Ear: Ear canal normal.      Mouth/Throat:      Mouth: Mucous membranes are moist.   Eyes:      Extraocular Movements: Extraocular movements intact.      Conjunctiva/sclera: Conjunctivae normal.   Neck:      Vascular: No carotid bruit.   Cardiovascular:      Rate and Rhythm: Normal rate and regular rhythm.      Heart sounds: Normal heart sounds. No murmur heard.     No gallop.   Pulmonary:      Effort: Pulmonary effort is normal. No respiratory distress.      Breath sounds: No wheezing, rhonchi or rales.   Abdominal:      General: Abdomen is flat. Bowel sounds are normal. There is no distension.      Palpations: Abdomen is soft.      Tenderness: There is no abdominal tenderness. There is no right CVA tenderness, left CVA tenderness, guarding or rebound.   Musculoskeletal:      Cervical back: Normal range of motion and neck supple.      Right lower leg: No edema.      Left lower leg: No edema.   Skin:  General: Skin is warm and dry.      Coloration: Skin is not jaundiced.      Findings: No erythema or rash.   Neurological:      Mental Status: He is alert and oriented to person, place, and time.   Psychiatric:         Mood and Affect: Mood normal.         Behavior: Behavior normal.         Thought Content: Thought content normal.         Judgment: Judgment normal.        Vital Signs -   Visit Vitals  BP 124/76 (Site: Left Upper Arm, Position: Sitting, Cuff Size: Large Adult)   Pulse 70   Ht 1.829 m (6')   Wt 95.3 kg (210 lb)   SpO2 98%   BMI 28.48 kg/m              LABS  Results for orders placed or performed in visit on 03/31/23   POC Urinalysis, Auto W/O Scope (81003)   Result Value Ref Range    Color  (UA POC) Yellow     Clarity (UA POC) Clear     Glucose, Urine, POC Negative     Bilirubin, Urine, POC Negative     Ketones, Urine, POC Negative     Specific Gravity, Urine, POC 1.025 1.001 - 1.035    Blood (UA POC) Negative     pH, Urine, POC 6.0 4.6 - 8.0    Protein, Urine, POC Negative     Urobilinogen, POC 0.2 mg/dL     Nitrite, Urine, POC Negative     Leukocyte Esterase, Urine, POC Negative      Orders Only on 12/27/2022   Component Date Value Ref Range Status    Uric Acid 12/27/2022 6.9  3.4 - 7.0 mg/dL Final    PSA, Screening 12/27/2022 0.709  0.000 - 4.000 ng/mL Final    Comment: PSA INTERPRETATION:    PSA measured by Roche Cobas electrochemiluminescence immunoassay "ECLIA"  methodology.    At this time, no major scientific/medical organization including the American  Cancer Society and the American Urological Association have specific  recommendations in regard to prostate specific antigen (PSA) testing other  than recommending that men at age 65 and above and those who are at high risk  at age 48-45 and above be counseled in regard to the pros and cons of PSA  testing.    Guidelines for risk stratification    1.  Below 1:  Very low risk of prostate cancer  2.  1-4:       Approximately 15% risk of prostate cancer  3.  4-10:      25% risk of prostate cancer  4.  >10:       50% risk of prostate cancer    The use of PSA velocity (rate of rise of PSA over time) may also be useful in  predicting the risk of prostate cancer.  Most authorities recommend PSA  testing on at least three occasions over a period of 18 months to get an  accurate PSA velocity.    References                            available by request.      Sodium 12/27/2022 139  135 - 145 mmol/L Final    Potassium 12/27/2022 4.7  3.5 - 5.3 mmol/L  Final    Chloride 12/27/2022 101  98 - 107 mmol/L Final    CO2 12/27/2022 28  22 - 29 mmol/L Final    Glucose 12/27/2022 98  70 - 99 mg/dL Final    BUN 95/28/4132 19  8 - 23 mg/dL Final    Creatinine  44/10/270 1.2  0.7 - 1.3 mg/dL Final    Anion Gap 53/66/4403 10  2 - 17 mmol/L Final    Osmolaliy Calculated 12/27/2022 280  270 - 287 mOsm/kg Final    Calcium 12/27/2022 10.0  8.8 - 10.2 mg/dL Final    Total Protein 12/27/2022 6.9  6.4 - 8.3 g/dL Final    Albumin 47/42/5956 4.7  3.5 - 5.2 g/dL Final    Globulin 38/75/6433 2.2  1.9 - 4.4 g/dL Final    Albumin/Globulin Ratio 12/27/2022 2.10  1.00 - 2.70 Final    Total Bilirubin 12/27/2022 0.36  0.00 - 1.20 mg/dL Final    Alk Phosphatase 12/27/2022 60  40 - 130 unit/L Final    AST 12/27/2022 26  0 - 50 unit/L Final    ALT 12/27/2022 20  0 - 50 unit/L Final    Est, Glom Filt Rate 12/27/2022 68  >=60 mL/min/1.82m Final    Comment: VERIFIED by Discern Expert.  GFR Interpretation:                                                                         % OF  KIDNEY  GFR                                                        STAGE  FUNCTION  ==================================================================================    > 90        Normal kidney function                       STAGE 1  90-100%  89 to 60      Mild loss of kidney function                 STAGE 2  80-60%  59 to 45      Mild to moderate loss of kidney function     STAGE 3a  59-45%  44 to 30      Moderate to severe loss of kidney function   STAGE 3b  44-30%  29 to 15      Severe loss of kidney function               STAGE 4  29-15%    < 15        Kidney failure                               STAGE 5  <15%  ==================================================================================  Modified from National Kidney Foundation    GFR Calculation performed using the CKD-EPI 2021 equation developed for use  with IDMS traceable creatinine methods and  is the calculation recommended by  the [facility] for estimating GFR in adults.      Cholesterol 12/27/2022 158  100 - 200 mg/dL Final    Comment: The National Cholesterol Education Program has published  reference  cholesterol values for cardiovascular risk to be:    Less than 200 mg/dL     = Low Risk    161 to 239 mg/dL        = Borderline Risk    240mg /dL and greater    = High Risk      HDL 12/27/2022 43  >=40 mg/dL Final    Comment: The National Lipid Association and the [employer] Cholesterol Education Program  (NCEP) have set the guidelines for high-density lipoprotein (HDL) cholesterol  in adults ages 73 and up.      Triglycerides 12/27/2022 199 (H)  0 - 149 mg/dL Final    Comment:   TRIGLYCERIDE INTERPRETATION:                          Recommended Fasting Triglyc Levels for Adults                        =============================================                        Desirable                        < 150 mg/dL                        Average                          < 200 mg/dL                        Borderline High             200 to 500 mg/dL                        Hypertriglyceridemic             > 500 mg/dL                        =============================================      LDL Cholesterol 12/27/2022 75.2  0.0 - 100.0 mg/dL Final    LDL/HDL Ratio 12/27/2022 1.7   Final    Chol/HDL Ratio 12/27/2022 3.7  0.0 - 4.4 Final    VLDL 12/27/2022 39.8  5.0 - 40.0 mg/dL Final       IMPRESSION/PLAN    Encounter Diagnoses   Name Primary?    Acute prostatitis Yes    Benign prostatic hyperplasia with urinary frequency      1. Acute prostatitis  Assessment & Plan:    patient started some leftover doxycycline at home yesterday.  Patient states that since starting the antibiotic his symptoms of dysuria and pelvic pain have started to improve.  Will extend antibiotic course for 1 week and send urine for culture.  Patient will notify me if symptoms do not fully resolve.  Orders:  -     doxycycline hyclate (VIBRA-TABS) 100 MG tablet; Take 1 tablet by mouth 2 times daily for 7 days, Disp-14 tablet, R-0Normal  -  POC Urinalysis, Auto W/O Scope (81003)  -     Culture, Urine; Future  2. Benign prostatic hyperplasia  with urinary frequency  Assessment & Plan:    patient's PSA earlier this year was within normal limits.  Patient states that he has been having lower urinary tract symptoms months prior to current episode of prostatitis.  Will start patient on Flomax.  Discussed potential for use of finasteride or saw palmetto supplements in the future.  Patient can also consider surgical procedures like UroLift if medications fail.  Orders:  -     tamsulosin (FLOMAX) 0.4 MG capsule; Take 1 capsule by mouth daily, Disp-30 capsule, R-5Normal           Follow up and Dispositions:  Return in about 3 months (around 07/01/2023).       Devin Going, DO

## 2023-04-01 ENCOUNTER — Encounter

## 2023-04-01 DIAGNOSIS — N410 Acute prostatitis: Secondary | ICD-10-CM

## 2023-04-01 LAB — CULTURE, URINE: FINAL REPORT: NO GROWTH

## 2023-04-01 NOTE — Assessment & Plan Note (Signed)
patient started some leftover doxycycline at home yesterday.  Patient states that since starting the antibiotic his symptoms of dysuria and pelvic pain have started to improve.  Will extend antibiotic course for 1 week and send urine for culture.  Patient will notify me if symptoms do not fully resolve.

## 2023-04-01 NOTE — Assessment & Plan Note (Signed)
patient's PSA earlier this year was within normal limits.  Patient states that he has been having lower urinary tract symptoms months prior to current episode of prostatitis.  Will start patient on Flomax.  Discussed potential for use of finasteride or saw palmetto supplements in the future.  Patient can also consider surgical procedures like UroLift if medications fail.

## 2023-05-19 ENCOUNTER — Telehealth: Payer: Self-pay | Admitting: Cardiovascular Disease

## 2023-05-19 NOTE — Telephone Encounter (Signed)
Pt moved out of town and no longer needs to be seen at out office.

## 2023-07-01 ENCOUNTER — Ambulatory Visit: Admit: 2023-07-01 | Discharge: 2023-07-01 | Payer: BLUE CROSS/BLUE SHIELD

## 2023-07-01 VITALS — BP 110/72 | HR 81 | Ht 70.0 in | Wt 208.2 lb

## 2023-07-01 DIAGNOSIS — E781 Pure hyperglyceridemia: Secondary | ICD-10-CM

## 2023-07-01 MED ORDER — TAMSULOSIN HCL 0.4 MG PO CAPS
0.4 | ORAL_CAPSULE | Freq: Every day | ORAL | 3 refills | Status: DC
Start: 2023-07-01 — End: 2023-10-07

## 2023-07-01 NOTE — Progress Notes (Signed)
 Elsie Drain (DOB:  04-May-1956) is a 67 y.o. male here for evaluation of the following chief complaint(s):  Follow-up (Patient states no changes since starting new medication ) and Health Maintenance (Patient decline flu vaccine )    Assessment and Plan   1. Benign prostatic hyperplasia with urinary frequency  The following orders have not been finalized:  -     tamsulosin  (FLOMAX ) 0.4 MG capsule  Patient doing much better on the Flomax .  Will hold in urology referral at this time but told him he can call and request this from me if he changes his mind.  We plan for his annual early next year and labs are ordered for that.  Return in about 6 months (around 12/29/2023) for annual, pt to get labs before.  Subjective   History  67 yr old here for follow up on new med    He was treated with doxycycliine for prostatitis and did feel improvement.     He says things are going good since starting the flomax . Now instead of 2-3 X up at night to  urinate he is getting up maybe one time.   Also no longer having any discomfort in lower pelvic area.     Objective   Physical Exam  Vitals reviewed.   HENT:      Head: Normocephalic.   Eyes:      Extraocular Movements: Extraocular movements intact.   Cardiovascular:      Rate and Rhythm: Normal rate and regular rhythm.      Heart sounds: No murmur heard.  Pulmonary:      Effort: Pulmonary effort is normal. No respiratory distress.      Breath sounds: Normal breath sounds. No wheezing.   Skin:     General: Skin is warm and dry.   Neurological:      General: No focal deficit present.      Mental Status: He is alert.   Psychiatric:         Mood and Affect: Mood normal.       No results found for any visits on 07/01/23.        03/31/2023     4:19 PM   PHQ-9    Little interest or pleasure in doing things 0   Feeling down, depressed, or hopeless 0   Trouble falling or staying asleep, or sleeping too much 0   Feeling tired or having little energy 0   Poor appetite or overeating 0   Feeling  bad about yourself - or that you are a failure or have let yourself or your family down 0   Trouble concentrating on things, such as reading the newspaper or watching television 0   Moving or speaking so slowly that other people could have noticed. Or the opposite - being so fidgety or restless that you have been moving around a lot more than usual 0   Thoughts that you would be better off dead, or of hurting yourself in some way 0   PHQ-2 Score 0   PHQ-9 Total Score 0   If you checked off any problems, how difficult have these problems made it for you to do your work, take care of things at home, or get along with other people? 0        An electronic signature was used to authenticate this note.    -Kenzie Flakes CHRISTELLA Harpin, MD    This document was created using voice transcription software. Mistakes are possible. Please contact  dino of this note if there are any concerns or if clarification is needed regarding the wording in this document.

## 2023-09-30 NOTE — Telephone Encounter (Signed)
 Called patient to possibly get a fax number for the requested [facility]. LVM

## 2023-09-30 NOTE — Telephone Encounter (Signed)
 Patient will be going to [facility] tomorrow or Thursday to have his labs drawn.    Please send orders to [facility]    Please notify once orders are sent

## 2023-09-30 NOTE — Telephone Encounter (Signed)
 He said he doesn't have a fax number for the lab. He said you can email the labs to him at [email or web address]

## 2023-10-01 NOTE — Telephone Encounter (Signed)
 Lab order faxed to [facility] at [phone]

## 2023-10-04 LAB — PSA, DIAGNOSTIC: PSA: 0.7 ng/mL (ref 0.0–4.0)

## 2023-10-04 LAB — LIPID PANEL
Cholesterol, Total: 147 mg/dL (ref 100–199)
HDL: 40 mg/dL (ref >39)
LDL Cholesterol: 77 mg/dL (ref 0–99)
Triglycerides: 176 mg/dL — ABNORMAL HIGH (ref 0–149)
VLDL Cholesterol Calculated: 30 mg/dL (ref 5–40)

## 2023-10-04 LAB — COMPREHENSIVE METABOLIC PANEL
ALT: 18 [IU]/L (ref 0–44)
AST: 23 [IU]/L (ref 0–40)
Albumin: 4.9 g/dL (ref 3.9–4.9)
Alkaline Phosphatase: 72 [IU]/L (ref 44–121)
BUN/Creatinine Ratio: 15 (ref 10–24)
BUN: 18 mg/dL (ref 8–27)
CO2: 23 mmol/L (ref 20–29)
Calcium: 10 mg/dL (ref 8.6–10.2)
Chloride: 101 mmol/L (ref 96–106)
Creatinine: 1.17 mg/dL (ref 0.76–1.27)
Est, Glom Filt Rate: 69 mL/min/{1.73_m2} (ref >59)
Globulin, Total: 2 g/dL (ref 1.5–4.5)
Glucose: 113 mg/dL — ABNORMAL HIGH (ref 70–99)
Potassium: 4.6 mmol/L (ref 3.5–5.2)
Sodium: 138 mmol/L (ref 134–144)
Total Bilirubin: 0.3 mg/dL (ref 0.0–1.2)
Total Protein: 6.9 g/dL (ref 6.0–8.5)

## 2023-10-04 LAB — URIC ACID: Uric Acid: 6.1 mg/dL (ref 3.8–8.4)

## 2023-10-07 ENCOUNTER — Ambulatory Visit: Admit: 2023-10-07 | Discharge: 2023-10-07 | Payer: MEDICARE

## 2023-10-07 VITALS — BP 110/68 | HR 73 | Resp 18 | Ht 70.0 in | Wt 204.3 lb

## 2023-10-07 DIAGNOSIS — I2510 Atherosclerotic heart disease of native coronary artery without angina pectoris: Principal | ICD-10-CM

## 2023-10-07 MED ORDER — FENOFIBRATE 145 MG PO TABS
145 | ORAL_TABLET | Freq: Every day | ORAL | 3 refills | Status: DC
Start: 2023-10-07 — End: 2023-12-25

## 2023-10-07 MED ORDER — TAMSULOSIN HCL 0.4 MG PO CAPS
0.4 | ORAL_CAPSULE | Freq: Every day | ORAL | 3 refills | Status: DC
Start: 2023-10-07 — End: 2023-12-25

## 2023-10-07 MED ORDER — ESCITALOPRAM OXALATE 10 MG PO TABS
10 | ORAL_TABLET | Freq: Every day | ORAL | 3 refills | Status: DC
Start: 2023-10-07 — End: 2023-12-25

## 2023-10-07 MED ORDER — DIPHENOXYLATE-ATROPINE 2.5-0.025 MG PO TABS
2.5-0.025 | ORAL_TABLET | Freq: Every day | ORAL | 3 refills | Status: DC | PRN
Start: 2023-10-07 — End: 2023-12-25

## 2023-10-07 NOTE — Progress Notes (Signed)
 Brett Mcclain (DOB:  07-18-1956) is a 67 y.o. male here for evaluation of the following chief complaint(s):  Follow-up (Yearly follow up. Patient has no concerns today )    Assessment and Plan   1. Coronary artery disease involving native coronary artery

## 2023-12-04 NOTE — Telephone Encounter (Signed)
Calling to cancel NP apt, provider clinic change/ spoke with pt's wife. PT will call back     PT can see any provider as he has not established with anyone yet

## 2023-12-08 ENCOUNTER — Encounter: Payer: MEDICARE | Attending: Cardiovascular Disease

## 2023-12-25 ENCOUNTER — Encounter

## 2023-12-25 NOTE — Telephone Encounter (Signed)
 Patient would like to request a pharmacy change now that his insurance is with [facility]    Please switch all medications to [facility] and notify patient once Rx's have been sent

## 2023-12-25 NOTE — Telephone Encounter (Signed)
 Medications pending to be sent to new pharmacy.

## 2023-12-26 MED ORDER — ROSUVASTATIN CALCIUM 20 MG PO TABS
20 | ORAL_TABLET | Freq: Every day | ORAL | 3 refills | Status: DC
Start: 2023-12-26 — End: 2024-08-03

## 2023-12-26 MED ORDER — ESCITALOPRAM OXALATE 10 MG PO TABS
10 | ORAL_TABLET | Freq: Every day | ORAL | 3 refills | 30.00000 days | Status: DC
Start: 2023-12-26 — End: 2024-05-24

## 2023-12-26 MED ORDER — FENOFIBRATE 145 MG PO TABS
145 | ORAL_TABLET | Freq: Every day | ORAL | 3 refills | Status: DC
Start: 2023-12-26 — End: 2024-06-22

## 2023-12-26 MED ORDER — TAMSULOSIN HCL 0.4 MG PO CAPS
0.4 | ORAL_CAPSULE | Freq: Every day | ORAL | 3 refills | Status: DC
Start: 2023-12-26 — End: 2024-06-22

## 2023-12-26 MED ORDER — DIPHENOXYLATE-ATROPINE 2.5-0.025 MG PO TABS
2.5-0.025 | ORAL_TABLET | Freq: Every day | ORAL | 3 refills | 10.00000 days | Status: DC | PRN
Start: 2023-12-26 — End: 2024-04-06

## 2023-12-29 ENCOUNTER — Encounter

## 2024-01-28 ENCOUNTER — Ambulatory Visit: Admit: 2024-01-28 | Discharge: 2024-01-28 | Payer: MEDICARE | Attending: Cardiovascular Disease

## 2024-01-28 VITALS — BP 122/70 | HR 75 | Resp 16 | Ht 71.0 in | Wt 199.0 lb

## 2024-01-28 DIAGNOSIS — I2510 Atherosclerotic heart disease of native coronary artery without angina pectoris: Secondary | ICD-10-CM

## 2024-01-28 MED ORDER — EZETIMIBE 10 MG PO TABS
10 | ORAL_TABLET | Freq: Every day | ORAL | 3 refills | Status: DC
Start: 2024-01-28 — End: 2024-07-19

## 2024-01-28 NOTE — Progress Notes (Signed)
 Date:  January 28, 2024  Patient name: Brett Mcclain  Date of Birth: 11/22/1955    CARDIOLOGY CLINIC EVALUATION      HISTORY OF PRESENT ILLNESS            Brett Mcclain is a 68 y.o. male presents for cardiovascular evaluation.  Patient seen by cardiology in [facility].  Cardiac history detailed below.  History of coronary disease status post PCI.      Patient presents to establish cardiac care.  Recent moved to the [place] area.  His prior angina was a jaw discomfort.  Recently's been feeling okay.  Denies significant chest pain or chest tightness.  Denies symptoms of the jaw discomfort that he had before.  Denies resting shortness of breath.  Mild dyspnea on exertion.  No acute change now compared to a year ago.  He does feel more fatigue now compared to 1 or 2 years ago.  He does have palpitations.  Had PVCs.  Denies overt syncope.  His palpitations have improved since starting magnesium.  No acute complaints today      PAST MEDICAL, SOCIAL AND FAMILY HISTORY          Past Medical History:   has a past medical history of Anxiety, Congenital heart disease, Hyperlipidemia, Irritable bowel syndrome (IBS), Osteoarthritis, and Skin cancer.    Past Surgical History:   has a past surgical history that includes Coronary angioplasty with stent and Cosmetic surgery.     Social History:   reports that he has never smoked. He has never used smokeless tobacco. He reports current alcohol use of about 10.0 standard drinks of alcohol per week. He reports that he does not use drugs.     Family History: family history includes Cancer in his mother; Heart Disease in his father; Unknown in his maternal grandfather and maternal grandmother.      MEDICATIONS AND ALLERGIES           Current Outpatient Medications   Medication Instructions    aspirin 81 mg, DAILY     Coenzyme Q10 (CO Q 10 PO) Take by mouth    diphenoxylate-atropine (LOMOTIL) 2.5-0.025 MG per tablet 1 tablet, Oral, DAILY PRN    escitalopram (LEXAPRO) 10 mg, Oral, DAILY    ezetimibe (ZETIA) 10 mg, Oral, DAILY    fenofibrate (TRICOR) 145 mg, Oral, DAILY    magnesium (MAGNESIUM-OXIDE) 250 mg, DAILY    rosuvastatin (CRESTOR) 20 mg, Oral, DAILY    tamsulosin (FLOMAX) 0.4 mg, Oral, DAILY        Allergies:  Icosapent ethyl (epa ethyl ester) (fish)      REVIEW OF SYSTEMS       Review of Systems   Constitutional:  Negative for chills, fatigue and unexpected weight change.   HENT:  Negative for hearing loss, nosebleeds and sore throat.    Respiratory:  Negative for cough, chest tightness, shortness of breath and wheezing.    Cardiovascular:  Positive for palpitations. Negative for chest pain and leg swelling.   Gastrointestinal:  Negative for blood in stool, constipation, diarrhea and nausea.   Endocrine: Negative for polydipsia.   Genitourinary:  Negative for dysuria.   Musculoskeletal:  Negative for myalgias.   Neurological:  Negative for dizziness, syncope, weakness, light-headedness and numbness.          PHYSICAL EXAM          BP 122/70 (BP Site: Left Upper Arm, Patient Position: Sitting, BP Cuff Size: Medium Adult)   Pulse 75  Resp 16   Ht 1.803 m (5\' 11" )   Wt 90.3 kg (199 lb)   SpO2 97%   BMI 27.75 kg/m      Gen:  Well-nourished. In no apparent distress, Pleasant  HEENT: normocephalic, supple, non-tender, no JVD  Cardiovascular: Normal rate, Regular rhythm, normal S1/2, no murmur  Lungs: Clear to auscultation bilaterally  Abdomen: Soft, nontender, nondistended, BS present  Extremities: No clubbing cyanosis or edema, 2+ radial pulses bilaterally  Neuro:  Alert and oriented 3. No focal deficits. Ambulated into the office today without issue  Psych:  Appropriate mood and affect  Skin: No rashes.  No cyanosis    LABS        No results found for: "WBC", "HGB", "HCT", "MCV", "PLT"  Lab Results   Component  Value Date    NA 138 10/03/2023    K 4.6 10/03/2023    CL 101 10/03/2023    CO2 23 10/03/2023    BUN 18 10/03/2023    CREATININE 1.17 10/03/2023    GLUCOSE 113 (H) 10/03/2023    CALCIUM 10.0 10/03/2023    BILITOT 0.3 10/03/2023    ALKPHOS 72 10/03/2023    AST 23 10/03/2023    ALT 18 10/03/2023    LABGLOM 69 10/03/2023    GLOB 2.2 12/27/2022     Cholesterol, Total (mg/dL)   Date Value   16/07/9603 147     Cholesterol (mg/dL)   Date Value   54/06/8118 158     LDL Cholesterol (mg/dL)   Date Value   14/78/2956 77   12/27/2022 75.2     HDL (mg/dL)   Date Value   21/30/8657 40   12/27/2022 43     Triglycerides (mg/dL)   Date Value   84/69/6295 176 (H)   12/27/2022 199 (H)      No results found for: "LIPOA"  No results found for: "APOB"  No results found for: "NTPROBNP"    The 10-year ASCVD risk score (Arnett DK, et al., 2019) is: 10.8%    Values used to calculate the score:      Age: 77 years      Sex: Male      Is Non-Hispanic African American: No      Diabetic: No      Tobacco smoker: No      Systolic Blood Pressure: 122 mmHg      Is BP treated: No      HDL Cholesterol: 40 mg/dL      Total Cholesterol: 147 mg/dL     CARDIAC DIAGNOSTICS          Encounter Date: 01/28/24   EKG 12 Lead    Impression    Sinus Rhythm   WITHIN NORMAL LIMITS       ASSESSMENT AND RECOMMENDATIONS        1. Atherosclerosis of native coronary artery of native heart without angina pectoris  Status post PCI and stenting to the right coronary artery in 2014.  No heart catheterization since then.  He did not have typical angina.  Did have jaw discomfort.  Quit all nicotine in 2009.  Father had premature coronary artery disease.  Father is not a nicotine user.  Patient is feeling okay now.  Denies symptoms typical of angina.  Denies has prior jaw discomfort which was his anginal equivalent.  Denies heart failure decompensation symptoms.  Will continue with aggressive medical treatment    2. Shortness of breath  Denies symptoms of decompensated CHF.  Does have dyspnea on exertion and increased fatigue.  Had PVCs.  Will evaluate cardiac structure and function with an echocardiogram.  - Echo (TTE) complete (PRN contrast/bubble/strain/3D); Future    3. Dyslipidemia  On rosuvastatin 20 mg a day.  Intolerant to other statins.  Also on fenofibrate.  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  Triglycerides approximately 200.  LDL approximately 80.  Due to his history it like to have his LDL less than 55.  Will add Zetia 10 mg a day.  Will also check a LP(a) next time he has routine lab work.  We discussed we should hopefully have treatment options in the next year for this.  - Lipoprotein A (LPA); Future    4. History of percutaneous coronary intervention    5. Palpitations  No known history of atrial fibrillation.  Denies malignant palpitations.  Patient's had PVCs in the past.  These improved with magnesium supplementation which she is still taking.  Will make sure LV systolic function is normal.  Prior PVC workup benign for the patient's report.  - EKG 12 Lead     Return in about 6 months (around 07/29/2024).         Weber Cooks Myer Haff, MD [facility] Cardiology / [facility]

## 2024-02-05 ENCOUNTER — Encounter

## 2024-02-06 LAB — LIPOPROTEIN A (LPA): Lipoprotein (a): <8.4 nmol/L (ref <75.0)

## 2024-02-09 ENCOUNTER — Ambulatory Visit: Payer: MEDICARE

## 2024-03-02 ENCOUNTER — Inpatient Hospital Stay: Admit: 2024-03-02 | Payer: MEDICARE | Attending: Cardiovascular Disease

## 2024-03-02 VITALS — BP 122/70 | Ht 71.0 in | Wt 199.0 lb

## 2024-03-02 DIAGNOSIS — R0602 Shortness of breath: Secondary | ICD-10-CM

## 2024-03-02 LAB — ECHO (TTE) COMPLETE (PRN CONTRAST/BUBBLE/STRAIN/3D)
AV Peak Gradient: 6 mmHg
AV Peak Velocity: 1.3 m/s
AV Velocity Ratio: 0.92
Ao Root Index: 1.43 cm/m2
Aortic Root: 3 cm
Ascending Aorta Index: 1.71 cm/m2
Ascending Aorta: 3.6 cm
Body Surface Area: 2.13 m2
E/E' Lateral: 5.52
E/E' Ratio (Averaged): 6.96
E/E' Septal: 8.41
EF BP: 61 % (ref 55–100)
EF Physician: 60 %
Est. RA Pressure: 3 mmHg
Fractional Shortening 2D: 38 % (ref 28–44)
Global Longitudinal Strain: -16.6 %
Global Longitudinal Strain: -18.8 %
Global Longitudinal Strain: -19.3 %
Global Longitudinal Strain: -20.6 %
IVC Expiration: 2.2 cm
IVSd: 0.8 cm (ref 0.6–1.0)
LA Area 2C: 16.3 cm2
LA Area 4C: 16.9 cm2
LA Diameter: 3.5 cm
LA Major Axis: 5.2 cm
LA Minor Axis: 5.6 cm
LA Size Index: 1.67 cm/m2
LA Volume BP: 42 mL (ref 18–58)
LA Volume Index BP: 20 mL/m2 (ref 16–34)
LA Volume Index MOD A2C: 19 mL/m2 (ref 16–34)
LA Volume Index MOD A4C: 20 mL/m2 (ref 16–34)
LA Volume MOD A2C: 40 mL (ref 18–58)
LA Volume MOD A4C: 42 mL (ref 18–58)
LA/AO Root Ratio: 1.17
LV E' Lateral Velocity: 14.5 cm/s
LV E' Septal Velocity: 9.51 cm/s
LV EDV A2C: 93 mL
LV EDV A4C: 107 mL
LV EDV Index A2C: 44 mL/m2
LV EDV Index A4C: 51 mL/m2
LV ESV A2C: 43 mL
LV ESV A4C: 34 mL
LV ESV Index A2C: 20 mL/m2
LV ESV Index A4C: 16 mL/m2
LV Ejection Fraction A2C: 53 %
LV Ejection Fraction A4C: 68 %
LV Mass 2D Index: 70.4 g/m2 (ref 49–115)
LV Mass 2D: 147.8 g (ref 88–224)
LV RWT Ratio: 0.42
LVIDd Index: 2.29 cm/m2
LVIDd: 4.8 cm (ref 4.2–5.9)
LVIDs Index: 1.43 cm/m2
LVIDs: 3 cm
LVOT Mean Gradient: 2 mmHg
LVOT Peak Gradient: 5 mmHg
LVOT Peak Velocity: 1.2 m/s
LVOT VTI: 28.2 cm
LVPWd: 1 cm (ref 0.6–1.0)
MV A Velocity: 0.9 m/s
MV E Velocity: 0.8 m/s
MV E Wave Deceleration Time: 184 ms
MV E/A: 0.89
PV Max Velocity: 0.8 m/s
PV Peak Gradient: 2 mmHg
RV Basal Dimension: 3.1 cm
RV Free Wall Peak S': 12.7 cm/s
RV Longitudinal Dimension: 6.9 cm
RV Mid Dimension: 2.7 cm
RVSP: 20 mmHg
TAPSE: 2.2 cm (ref 1.7–?)
TR Max Velocity: 2.08 m/s
TR Peak Gradient: 17 mmHg

## 2024-04-06 ENCOUNTER — Ambulatory Visit: Admit: 2024-04-06 | Discharge: 2024-04-06 | Payer: MEDICARE

## 2024-04-06 VITALS — BP 118/82 | HR 60 | Resp 18 | Ht 71.0 in | Wt 194.9 lb

## 2024-04-06 DIAGNOSIS — Z0000 Encounter for general adult medical examination without abnormal findings: Secondary | ICD-10-CM

## 2024-04-06 MED ORDER — DIPHENOXYLATE-ATROPINE 2.5-0.025 MG PO TABS
2.5-0.025 | ORAL_TABLET | Freq: Every day | ORAL | 3 refills | 15.00000 days | Status: DC | PRN
Start: 2024-04-06 — End: 2024-06-22

## 2024-04-06 NOTE — Progress Notes (Signed)
 Medicare Annual Wellness Visit    Brett Mcclain is here for Medicare AWV    Assessment & Plan   Welcome to Medicare preventive visit  I confirm that I have managed the broad scope of patient ... health needs by furnishing care for some or all of the patient's acute and chronic problems across a spectrum of diagnosis and organ systems that will require ongoing care with myself and our team.   Vision Screening    Right eye Left eye Both eyes   Without correction 20/20 20/30 20/20    With correction 20/15 20/30 20/15      Minicog: normal, 5, no concerns on cognition    EKG- no abnormal rhythm, no abnormal ST changes, patient did have some bradycardia which she is asymptomatic of    Irritable bowel syndrome with diarrhea  -     diphenoxylate -atropine  (LOMOTIL ) 2.5-0.025 MG per tablet; Take 1 tablet by mouth daily as needed for Diarrhea (ibs) for up to 360 days. Max Daily Amount: 1 tablet, Disp-90 tablet, R-3Normal    CAD involving native coronary artery of native heart without angina pectoralis  For patient's coronary artery disease reviewed most recent notes from cardiology.  Condition appears stable.  He show no diastolic dysfunction on most recent echocardiogram.  He is not having any symptoms related to PVCs.  Medications appear to be stable at current time and he has follow-up with them in October.  Was recommended to be on Zetia  and he felt this was statin medication.  We talked about the difference between the medication and he may consider trying to take it in addition to his statin and fenofibrate  in order for stricter control of his LDL.  Recurrent major depression in remission  His mood has been very good in regards to his history of depression he has tapered down to 5 mg of Lexapro , he will next taper completely off and let me know if he has any concerns with coming off of the medication.  He needed refill of his Lomotil  which helps him with his IBD diarrhea predominant symptoms.  He switching pharmacies,  refill provided for him today.     Return in about 1 year (around 04/06/2025) for annual medicare well / split annual visit / labs .     Subjective     68 year old male past history of CAD, status post stent placement, diastolic heart failure, PVCs-at last visit was referred to cardiology to establish care, also depression on Lexapro , GERD taking PPI as needed, mixed hyperlipidemia, history of melanoma-established with dermatologist, gout, BPH, arthritis, IBS on Lomotil  here for his annual Medicare well visit.    EKG shows sinus bradycardia, 54 bpm.  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 They have a boat here in town.     He did establish with Dr. Jeris Montes through cardiology.  He was not sure about taking 3 statin medications he says so he has not yet started the Zetia .    He has been on half lexapro  for about 2 weeks.  He feels that his mood is just been really good and stress is a lot less since retirement and he does not think that he needs the medication any longer.    Patient's complete Health Risk Assessment and screening values have been reviewed and are found in Flowsheets. The following problems were reviewed today and where indicated follow up appointments were made and/or  referrals ordered.    Positive Risk Factor Screenings with Interventions:                   Vision Screen:  Visual Acuity screen is abnormal due to a score of 20/25 or worse.  Interventions:   Patient comments: he has new glasses as of a couple of weeks ago. He does have some cataract starting and astigmatism has been a little worse.     Safety:  Do you have non-slip mats or non-slip surfaces or shower bars or grab bars in your shower or bathtub?: (!) No  Interventions:  See above     He does have textured floors that are nonslip and nonslip mats                Objective   Vision Screening    Right eye Left eye Both eyes   Without correction 20/20 20/30 20/20     With correction 20/15 20/30 20/15       Vitals:    04/06/24 0755   BP: 118/82   BP Site: Left Upper Arm   Patient Position: Sitting   BP Cuff Size: Medium Adult   Pulse: 60   Resp: 18   SpO2: 97%   Weight: 88.4 kg (194 lb 14.4 oz)   Height: 1.803 m (5' 11)      Body mass index is 27.18 kg/m.        General Appearance: alert and oriented to person, place and time, well-developed and well-nourished, in no acute distress and alert and oriented to person, place and time  Pulmonary/Chest: clear to auscultation bilaterally- no wheezes, rales or rhonchi, normal air movement, no respiratory distress  Cardiovascular: normal rate and normal S1 and S2            Allergies   Allergen Reactions    Icosapent Ethyl (Epa Ethyl Ester) (Fish) Diarrhea     Worsening diarrhea         Prior to Visit Medications    Medication Sig Taking? Authorizing Provider   Coenzyme Q10 (CO Q 10 PO) Take by mouth Yes [provider]   ezetimibe  (ZETIA ) 10 MG tablet Take 1 tablet by mouth daily Yes Mont Antis Althia Jetty, MD   tamsulosin  (FLOMAX ) 0.4 MG capsule Take 1 capsule by mouth daily Yes Lamar Naef, Dois Freeze, MD   rosuvastatin  (CRESTOR ) 20 MG tablet Take 1 tablet by mouth daily Yes Samiksha Pellicano, Dois Freeze, MD   fenofibrate  (TRICOR ) 145 MG tablet Take 1 tablet by mouth daily Yes Marquie Aderhold, Dois Freeze, MD   escitalopram  (LEXAPRO ) 10 MG tablet Take 1 tablet by mouth daily Yes Aashir Umholtz, Dois Freeze, MD   diphenoxylate -atropine  (LOMOTIL ) 2.5-0.025 MG per tablet Take 1 tablet by mouth daily as needed for Diarrhea (ibs) for up to 360 days. Max Daily Amount: 1 tablet Yes Gryphon Vanderveen, Dois Freeze, MD   magnesium (MAGNESIUM-OXIDE) 250 MG TABS tablet Take 1 tablet by mouth daily Yes [provider]   aspirin 81 MG EC tablet Take 1 tablet by mouth daily Yes [provider]       CareTeam (Including outside providers/suppliers regularly involved in providing care):   Patient Care Team:  Melita Springer, MD as PCP - General (Family Medicine)  Hunter Maha, Dois Freeze, MD as PCP -  Empaneled Provider  Mont Antis Althia Jetty, MD as Cardiologist (Cardiovascular Disease)     Recommendations for Preventive Services Due: see orders and patient instructions/AVS.  Recommended screening schedule for the next 5-10 years is provided  to the patient in written form: see Patient Instructions/AVS.     Reviewed and updated this visit:  Allergies  Meds  Sexual Hx

## 2024-04-06 NOTE — Patient Instructions (Signed)
 A Healthy Heart: Care Instructions  Overview     Coronary artery disease, also called heart disease, occurs when a substance called plaque builds up in the vessels that supply oxygen-rich blood to your heart muscle. This can narrow the blood vessels and reduce blood flow. A heart attack happens when blood flow is completely blocked. A high-fat diet, smoking, and other factors increase the risk of heart disease.  Your doctor has found that you have a chance of having heart disease. A heart-healthy lifestyle can help keep your heart healthy and prevent heart disease. This lifestyle includes eating healthy, being active, staying at a weight that's healthy for you, and not smoking or using tobacco. It also includes taking medicines as directed, managing other health conditions, and trying to get a healthy amount of sleep.  Follow-up care is a key part of your treatment and safety. Be sure to make and go to all appointments, and call your doctor if you are having problems. It's also a good idea to know your test results and keep a list of the medicines you take.  How can you care for yourself at home?  Diet    Use less salt when you cook and eat. This helps lower your blood pressure. Taste food before salting. Add only a little salt when you think you need it. With time, your taste buds will adjust to less salt.     Eat fewer snack items, fast foods, canned soups, and other high-salt, high-fat, processed foods.     Read food labels and try to avoid saturated and trans fats. They increase your risk of heart disease by raising cholesterol levels.     Limit the amount of solid fat--butter, margarine, and shortening--you eat. Use olive, peanut, or canola oil when you cook. Bake, broil, and steam foods instead of frying them.     Eat a variety of fruit and vegetables every day. Dark green, deep orange, red, or yellow fruits and vegetables are especially good for you. Examples include spinach, carrots, peaches, and  berries.     Foods high in fiber can reduce your cholesterol and provide important vitamins and minerals. High-fiber foods include whole-grain cereals and breads, oatmeal, beans, brown rice, citrus fruits, and apples.     Eat lean proteins. Heart-healthy proteins include seafood, lean meats and poultry, eggs, beans, peas, nuts, seeds, and soy products.     Limit drinks and foods with added sugar. These include candy, desserts, and soda pop.   Heart-healthy lifestyle    If your doctor recommends it, get more exercise. For many people, walking is a good choice. Or you may want to swim, bike, or do other activities. Bit by bit, increase the time you're active every day. Try for at least 30 minutes on most days of the week.     Try to quit or cut back on using tobacco and other nicotine products. This includes smoking and vaping. If you need help quitting, talk to your doctor about stop-smoking programs and medicines. These can increase your chances of quitting for good. Quitting is one of the most important things you can do to protect your heart. It is never too late to quit. Try to avoid secondhand smoke too.     Stay at a weight that's healthy for you. Talk to your doctor if you need help losing weight.     Try to get 7 to 9 hours of sleep each night.     Limit alcohol to 2  drinks a day for men and 1 drink a day for women. Too much alcohol can cause health problems.     Manage other health problems such as diabetes, high blood pressure, and high cholesterol. If you think you may have a problem with alcohol or drug use, talk to your doctor.   Medicines    Take your medicines exactly as prescribed. Call your doctor if you think you are having a problem with your medicine.     If your doctor recommends aspirin, take the amount directed each day. Make sure you take aspirin and not another kind of pain reliever, such as acetaminophen (Tylenol).   When should you call for help?   Call 911 if you have symptoms of a heart  attack. These may include:    Chest pain or pressure, or a strange feeling in the chest.     Sweating.     Shortness of breath.     Pain, pressure, or a strange feeling in the back, neck, jaw, or upper belly or in one or both shoulders or arms.     Lightheadedness or sudden weakness.     A fast or irregular heartbeat.   After you call 911, the operator may tell you to chew 1 adult-strength or 2 to 4 low-dose aspirin. Wait for an ambulance. Do not try to drive yourself.  Watch closely for changes in your health, and be sure to contact your doctor if you have any problems.  Where can you learn more?  Go to [employer] and enter F075 to learn more about A Healthy Heart: Care Instructions.  Current as of: May 28, 2023  Content Version: 14.5   [address] [place].   Care instructions adapted under license by [facility]. If you have questions about a medical condition or this instruction, always ask your healthcare professional. Laren Player, [facility], disclaims any warranty or liability for your use of this information.    Personalized Preventive Plan for Brett Mcclain - 04/06/2024  Medicare offers a range of preventive health benefits. Some of the tests and screenings are paid in full while other may be subject to a deductible, co-insurance, and/or copay.  Some of these benefits include a comprehensive review of your medical history including lifestyle, illnesses that may run in your family, and various assessments and screenings as appropriate.  After reviewing your medical record and screening and assessments performed today your provider may have ordered immunizations, labs, imaging, and/or referrals for you.  A list of these orders (if applicable) as well as your Preventive Care list are included within your After Visit Summary for your review.

## 2024-05-24 ENCOUNTER — Ambulatory Visit: Admit: 2024-05-24 | Discharge: 2024-05-24 | Payer: MEDICARE | Attending: Medical

## 2024-05-24 ENCOUNTER — Ambulatory Visit: Admit: 2024-05-24 | Discharge: 2024-05-24 | Payer: MEDICARE

## 2024-05-24 NOTE — Addendum Note (Signed)
 Addended by: BOYKIN CHIQUITA PATRICK on: 05/24/2024 08:42 AM     Modules accepted: Orders

## 2024-05-24 NOTE — Progress Notes (Signed)
 Date:  May 24, 2024  Patient name: Brett Mcclain  Date of Birth: Jul 13, 1956      REASON FOR CLINIC VISIT: No chief complaint on file.  Primary cardiologist Dr. Clois    HISTORY OF PRESENT ILLNESS          Brett Mcclain was seen in cardiology clinic today.     Patient is a 68 y.o. male with history of coronary artery disease status post PCI, hyperlipidemia, PVCs.  He was seen as a new patient 01/28/24.  He had an echocardiogram 03/02/2024 that showed normal LV size and function, mildly dilated RV, no hemodynamically significant valvular disease.    Returns today for follow-up with some concerns.  He has had issues with PVCs for many years but has recently had a drastic increase in PVCs.  He has had long-lasting spells of PVCs, no sustained tachycardia.  He feels dizzy with these, feels his heart thumping and discomfort in his head and neck.  He retired about 3 months ago, has actually had improvement in stress levels however these have been worse recently.  He denies any chest discomfort, but does report some effort intolerance and does feel that he cannot exert himself like he believes he should be able to.  He has spells of PVCs with exercise, sometimes during and afterwards, also with eating lunch.  He has cut out caffeine entirely.  He denies any other changes to explain this shift.    PAST HISTORY          Past Medical History:   has a past medical history of Anxiety, Congenital heart disease, Hyperlipidemia, Irritable bowel syndrome (IBS), Osteoarthritis, and Skin cancer.    Past Surgical History:   has a past surgical history that includes Coronary angioplasty with stent and Cosmetic surgery.     Social History:   reports that he has never smoked. He has never been exposed to tobacco smoke. He has never used smokeless tobacco. He reports current alcohol use of about 10.0 standard drinks of alcohol per week. He reports that he does not use drugs.     Family History: family history includes Cancer  in his mother; Heart Disease in his father; Unknown in his maternal grandfather and maternal grandmother.    Allergies:  Icosapent ethyl (epa ethyl ester) (fish)    Home Medications:    Prior to Admission medications    Medication Sig Start Date End Date Taking? Authorizing Provider   diphenoxylate -atropine  (LOMOTIL ) 2.5-0.025 MG per tablet Take 1 tablet by mouth daily as needed for Diarrhea (ibs) for up to 360 days. Max Daily Amount: 1 tablet 04/06/24 04/01/25 Yes Corso, Stephane HERO, MD   Coenzyme Q10 (CO Q 10 PO) Take by mouth   Yes [provider]   ezetimibe  (ZETIA ) 10 MG tablet Take 1 tablet by mouth daily 01/28/24  Yes Clois Elsie Clark, MD   tamsulosin  (FLOMAX ) 0.4 MG capsule Take 1 capsule by mouth daily 12/26/23  Yes Corso, Stephane HERO, MD   rosuvastatin  (CRESTOR ) 20 MG tablet Take 1 tablet by mouth daily 12/26/23  Yes Corso, Stephane HERO, MD   fenofibrate  (TRICOR ) 145 MG tablet Take 1 tablet by mouth daily 12/26/23  Yes Corso, Stephane HERO, MD   magnesium (MAGNESIUM-OXIDE) 250 MG TABS tablet Take 1 tablet by mouth daily   Yes [provider]   aspirin 81 MG EC tablet Take 1 tablet by mouth daily   Yes [provider]         PHYSICAL  EXAM            BP (!) 110/58 (BP Site: Right Upper Arm, Patient Position: Sitting, BP Cuff Size: Medium Adult)   Pulse 67   Ht 1.829 m (6')   Wt 89.8 kg (198 lb)   SpO2 98%   BMI 26.85 kg/m      CONSTITUTIONAL:  awake, alert, and oriented, no apparent distress  LUNGS:  No increased work of breathing, good air exchange, clear to auscultation bilaterally, no crackles or wheezing  CARDIOVASCULAR:  Normal apical impulse, regular rate and rhythm, normal S1 and S2, no S3 or S4, and no murmur noted, no edema  ABDOMEN:  Soft, non-tender, non-distended   EXTREMITIES:  there is no redness, warmth, or swelling of the joints; full range of motion noted  NECK: No JVD or carotid bruits      DIAGNOSTIC STUDIES          Electrocardiogram:  Encounter Date: 01/28/24   EKG 12 Lead     Impression    Sinus Rhythm   WITHIN NORMAL LIMITS       Echocardiogram:  03/02/24    ECHO (TTE) COMPLETE (PRN CONTRAST/BUBBLE/STRAIN/3D) 03/02/2024  5:44 PM (Final)    Interpretation Summary    Left Ventricle: Normal left ventricular systolic function with a visually estimated EF of 60 - 65%. Left ventricle size is normal. Normal wall thickness. Normal wall motion. Normal diastolic function.    Right Ventricle: Right ventricle is mildly dilated. Normal systolic function.    Image quality is good.    Signed by: Elsie Vaughan Daisy, MD on 03/02/2024  5:44 PM      Cardiac catheterization:  No results found for this or any previous visit.       Stress test:  No results found for this or any previous visit.         LABS           Lab Results   Component Value Date    CHOL 147 10/03/2023    CHOL 158 12/27/2022     Lab Results   Component Value Date    TRIG 176 (H) 10/03/2023    TRIG 199 (H) 12/27/2022     Lab Results   Component Value Date    HDL 40 10/03/2023    HDL 43 12/27/2022     No components found for: LDLCHOLESTEROL, [facility]  Lab Results   Component Value Date    VLDL 30 10/03/2023    VLDL 60.1 12/27/2022     Lab Results   Component Value Date    CHOLHDLRATIO 3.7 12/27/2022          ASSESSMENT / RECOMMENDATIONS        1.  Coronary artery disease: History of PCI with multiple stents greater than 10 years ago.  His last nuclear stress test was over 2 years ago.  He is having some effort intolerance, drastic increase in PVCs.  Echocardiogram from 03/02/2024 showed normal LV size and function.  Will risk stratify with a nuclear stress test.  2.  Hyperlipidemia: Most recent labs 10/03/2023 showed LDL of 77, HDL 40, triglycerides 176, total cholesterol 147.  Continue statin, Zetia , and fibrate  3.  PVCs: As above has had a recent drastic increase in PVCs he has been having worsening longer-lasting spells.  With exertion, sometimes with eating meals.  He reports that he is very symptomatic with these PVCs.  Will  put a heart monitor on him to evaluate for PVC burden,  will also risk stratify the nuclear stress test as he has had some effort intolerance as well.  Will check some additional labs to include TSH and magnesium.  He currently does take magnesium supplement, likely would not tolerate a beta-blocker at this time with soft pressures, his blood pressures are always low normal.  Will reassess after testing    Chiquita WENDI Greenland, [facility] Card[facility] / [facility]

## 2024-05-31 ENCOUNTER — Encounter

## 2024-05-31 LAB — BASIC METABOLIC PANEL
Anion Gap: 9 mmol/L (ref 2–17)
BUN: 20 mg/dL (ref 8–23)
CO2: 25 mmol/L (ref 22–29)
Calcium: 10.1 mg/dL (ref 8.5–10.7)
Chloride: 103 mmol/L (ref 98–107)
Creatinine: 1.2 mg/dL (ref 0.7–1.3)
Est, Glom Filt Rate: 67 mL/min/1.73mÂ² (ref >=60)
Glucose: 107 mg/dL — ABNORMAL HIGH (ref 70–99)
Osmolaliy Calculated: 277 mosm/kg (ref 270–287)
Potassium: 5 mmol/L (ref 3.5–5.3)
Sodium: 137 mmol/L (ref 135–145)

## 2024-05-31 LAB — MAGNESIUM: Magnesium: 2.1 mg/dL (ref 1.6–2.6)

## 2024-05-31 LAB — TSH REFLEX TO FT4: TSH: 2.54 u[IU]/mL (ref 0.358–3.740)

## 2024-06-03 ENCOUNTER — Inpatient Hospital Stay: Admit: 2024-06-03 | Payer: PRIVATE HEALTH INSURANCE

## 2024-06-03 ENCOUNTER — Inpatient Hospital Stay: Admit: 2024-06-03 | Discharge: 2024-07-02 | Payer: PRIVATE HEALTH INSURANCE

## 2024-06-03 ENCOUNTER — Inpatient Hospital Stay: Admit: 2024-06-03 | Discharge: 2024-06-15 | Payer: PRIVATE HEALTH INSURANCE

## 2024-06-03 DIAGNOSIS — I2510 Atherosclerotic heart disease of native coronary artery without angina pectoris: Principal | ICD-10-CM

## 2024-06-03 LAB — NM STRESS TEST WITH MYOCARDIAL PERFUSION
Baseline Diastolic BP: 70 mmHg
Baseline HR: 54 {beats}/min
Baseline Systolic BP: 110 mmHg
Exercise Duration Seconds: 10
Exercise Duration Time: 9
Recovery Stage 1 Duration: 1 min:sec
Recovery Stage 1 HR: 120 {beats}/min
Recovery Stage 2 Duration: 2 min:sec
Recovery Stage 2 HR: 103 {beats}/min
Recovery Stage 3 Duration: 4 min:sec
Recovery Stage 3 HR: 77 {beats}/min
Stress Diastolic BP: 70 mmHg
Stress Estimated Workload: 10.1 METS
Stress Peak HR: 141 {beats}/min
Stress Percent HR Achieved: 91 %
Stress Rate Pressure Product: 25380 BPM*mmHg
Stress Stage 1 Duration: 3 min:sec
Stress Stage 1 HR: 93 {beats}/min
Stress Stage 2 Duration: 6 min:sec
Stress Stage 2 HR: 104 {beats}/min
Stress Stage 3 Duration: 9 min:sec
Stress Stage 3 HR: 141 {beats}/min
Stress Systolic BP: 180 mmHg
Stress Target HR: 155 {beats}/min

## 2024-06-03 MED ORDER — TECHNETIUM TC 99M SESTAMIBI IV KIT
Freq: Once | INTRAVENOUS | Status: AC | PRN
Start: 2024-06-03 — End: 2024-06-03
  Administered 2024-06-03: 15:00:00 30 via INTRAVENOUS

## 2024-06-03 MED ORDER — TECHNETIUM TC 99M SESTAMIBI IV KIT
Freq: Once | INTRAVENOUS | Status: AC | PRN
Start: 2024-06-03 — End: 2024-06-03
  Administered 2024-06-03: 13:00:00 10 via INTRAVENOUS

## 2024-06-05 LAB — CARDIAC EVENT/MCOT MONITOR: Body Surface Area: 2.14 m2

## 2024-06-22 ENCOUNTER — Encounter

## 2024-06-22 NOTE — Telephone Encounter (Signed)
 Call  in from patient requesting medication refills for the following:    fenofibrate  (TRICOR ) 145 MG tablet , 1X DAY    QUANITITY 90    diphenoxylate -atropine  (LOMOTIL ) 2.5-0.025 MG per tablet , 1X DAY  QUANTITY 90    tamsulosin  (FLOMAX ) 0.4 MG capsule , 1X DAY  QUANTITY 90      No medicatiton changes since the last visit      PHARMACY  [facility] - [place], [place] - [address]

## 2024-06-22 NOTE — Telephone Encounter (Signed)
 LOMOTIL  - Medication and/or diagnosis, no protocol, sent to office to fill.    FLOMAX /TRICORE - MED WAS ES RECENTLY W/ 3 RF' S EACH. PLEASE NOTIFY PT.

## 2024-06-22 NOTE — Telephone Encounter (Signed)
 Meds pending w/ new pharmacy

## 2024-06-23 MED ORDER — FENOFIBRATE 145 MG PO TABS
145 | ORAL_TABLET | Freq: Every day | ORAL | 3 refills | Status: AC
Start: 2024-06-23 — End: ?

## 2024-06-23 MED ORDER — DIPHENOXYLATE-ATROPINE 2.5-0.025 MG PO TABS
2.5-0.025 | ORAL_TABLET | Freq: Every day | ORAL | 3 refills | Status: AC | PRN
Start: 2024-06-23 — End: 2025-06-18

## 2024-06-23 MED ORDER — TAMSULOSIN HCL 0.4 MG PO CAPS
0.4 | ORAL_CAPSULE | Freq: Every day | ORAL | 3 refills | Status: AC
Start: 2024-06-23 — End: ?

## 2024-06-23 NOTE — Telephone Encounter (Signed)
 Patient called requesting his script be sent to   [id number] - [place], [place] - [address] - P [phone] - F [phone]       Please assist

## 2024-06-23 NOTE — Telephone Encounter (Signed)
 To office for clarification pls

## 2024-07-02 ENCOUNTER — Encounter: Payer: MEDICARE | Attending: Cardiovascular Disease

## 2024-07-02 ENCOUNTER — Ambulatory Visit: Admit: 2024-07-02 | Discharge: 2024-07-02 | Payer: MEDICARE | Attending: Physician Assistant

## 2024-07-02 VITALS — BP 112/60 | HR 66 | Resp 17 | Ht 70.0 in | Wt 197.3 lb

## 2024-07-02 DIAGNOSIS — I2510 Atherosclerotic heart disease of native coronary artery without angina pectoris: Principal | ICD-10-CM

## 2024-07-02 NOTE — Patient Instructions (Signed)
 Cancel October appointment.    Return in 1 year or sooner if needed.

## 2024-07-02 NOTE — Progress Notes (Signed)
 Date:  July 02, 2024  Patient name: Brett Mcclain.  Date of Birth: 07/05/1956      REASON FOR CLINIC VISIT: Follow-up    Primary cardiologist Dr. Clois    HISTORY OF PRESENT ILLNESS          Brett Mcclain. was seen in cardiology clinic today.     Patient is a 68 y.o. male with history of coronary artery disease status post PCI, hyperlipidemia, PVCs.  He was seen in July complaining of an increase in PVCs.  Nuclear stress test and monitor were ordered along with labs.  Labs were unrevealing.  Stress test showed no ischemia.  He had rare PVCs during the test.  His overall PVC burden on monitor was 1%.  He says he is having much less PVCs at this point.    PAST HISTORY          Past Medical History:   has a past medical history of Anxiety, Congenital heart disease, Hyperlipidemia, Irritable bowel syndrome (IBS), Osteoarthritis, and Skin cancer.    Past Surgical History:   has a past surgical history that includes Coronary angioplasty with stent and Cosmetic surgery.     Social History:   reports that he has never smoked. He has never been exposed to tobacco smoke. He has never used smokeless tobacco. He reports current alcohol use of about 10.0 standard drinks of alcohol per week. He reports that he does not use drugs.     Family History: family history includes Cancer in his mother; Heart Disease in his father; Unknown in his maternal grandfather and maternal grandmother.    Allergies:  Icosapent ethyl (epa ethyl ester) (fish)    Home Medications:    Prior to Admission medications   Medication Sig Start Date End Date Taking? Authorizing Provider   tamsulosin  (FLOMAX ) 0.4 MG capsule Take 1 capsule by mouth daily 06/23/24  Yes Corso, Stephane HERO, MD   fenofibrate  (TRICOR ) 145 MG tablet Take 1 tablet by mouth daily 06/23/24  Yes Corso, Stephane HERO, MD   diphenoxylate -atropine  (LOMOTIL ) 2.5-0.025 MG per tablet Take 1 tablet by mouth daily as needed for Diarrhea (ibs) for up to 360 days. Max Daily Amount: 1  tablet 06/23/24 06/18/25 Yes Corso, Stephane HERO, MD   Coenzyme Q10 (CO Q 10 PO) Take by mouth   Yes [provider]   ezetimibe  (ZETIA ) 10 MG tablet Take 1 tablet by mouth daily 01/28/24  Yes Brett Brett Clark, MD   rosuvastatin  (CRESTOR ) 20 MG tablet Take 1 tablet by mouth daily 12/26/23  Yes Corso, Stephane HERO, MD   magnesium (MAGNESIUM-OXIDE) 250 MG TABS tablet Take 1 tablet by mouth daily   Yes [provider]   aspirin 81 MG EC tablet Take 1 tablet by mouth daily   Yes [provider]         PHYSICAL EXAM            BP 112/60 (BP Site: Right Upper Arm, Patient Position: Sitting, BP Cuff Size: Medium Adult)   Pulse 66   Resp 17   Ht 1.778 m (5' 10)   Wt 89.5 kg (197 lb 4.8 oz)   SpO2 99%   BMI 28.31 kg/m      CONSTITUTIONAL:  awake, alert, and oriented, no apparent distress  LUNGS:  No increased work of breathing, good air exchange, clear to auscultation bilaterally, no crackles or wheezing  CARDIOVASCULAR:  Normal apical impulse, regular rate and rhythm, normal S1  and S2, no S3 or S4, and no murmur noted, no edema  ABDOMEN:  Soft, non-tender, non-distended   EXTREMITIES:  there is no redness, warmth, or swelling of the joints; full range of motion noted  NECK: No JVD or carotid bruits      DIAGNOSTIC STUDIES          Electrocardiogram:  Encounter Date: 01/28/24   EKG 12 Lead    Impression    Sinus Rhythm   WITHIN NORMAL LIMITS       Echocardiogram:  03/02/24    ECHO (TTE) COMPLETE (PRN CONTRAST/BUBBLE/STRAIN/3D) 03/02/2024  5:44 PM (Final)    Interpretation Summary    Left Ventricle: Normal left ventricular systolic function with a visually estimated EF of 60 - 65%. Left ventricle size is normal. Normal wall thickness. Normal wall motion. Normal diastolic function.    Right Ventricle: Right ventricle is mildly dilated. Normal systolic function.    Image quality is good.    Signed by: Brett Vaughan Daisy, MD on 03/02/2024  5:44 PM      06/03/24    NM STRESS TEST WITH MYOCARDIAL  PERFUSION 06/14/2024  7:41 AM (Final)    Interpretation Summary    Stress Combined Conclusion: The study is negative for significant myocardial ischemia.    Stress Function: Left ventricular function post-stress is normal. Post-stress ejection fraction is 69%. The stress end diastolic cavity size is normal.    Perfusion Defect: Rest and stress images generally appear uniform without significant or large defects.  Small inferior fixed defect.  No significant reversibility.    ECG: Resting ECG demonstrates normal sinus rhythm.    Stress Test: A Bruce protocol stress test was performed. The patient was stressed for 9 minutes and 10 seconds. The patient reported no chest pain during the stress test.    Image quality is good.    Stress ECG: The stress ECG was negative for ischemia.    Signed by: Brett Vaughan Daisy, MD on 06/14/2024  7:41 AM     05/24/24    CARDIAC EVENT/MCOT MONITOR 06/05/2024  3:33 PM (Final)    Narrative  Normal sinus rhythm.  No ventricular tachycardia.  No atrial fibrillation.  No significant SVT.  No significant bradycardia or heart block.   Isolated PVCs.  1% PVC burden.    Impression: No sustained or significant arrhythmias.    Signed by: Brett Vaughan Daisy, MD on 06/05/2024  3:33 PM       ASSESSMENT / RECOMMENDATIONS        1. Atherosclerosis of native coronary artery of native heart without angina pectoris  He feels well with no symptoms of angina.  Continue aspirin and statin therapy.    2. PVC's (premature ventricular contractions)  Overall burden was 1% on monitor.  Offered as needed beta-blocker.  He does not feel it is necessary at this point but will call us  if his symptoms increase again.  Return in 1 yr or sooner if needed.       Marnette Perkins Brooke Sidda Humm, [facility] Cardiology / [facility]

## 2024-07-19 ENCOUNTER — Encounter

## 2024-07-20 MED ORDER — EZETIMIBE 10 MG PO TABS
10 | ORAL_TABLET | Freq: Every day | ORAL | 3 refills | Status: AC
Start: 2024-07-20 — End: ?

## 2024-07-20 NOTE — Telephone Encounter (Signed)
 Medication verified per last visit on 07/02/24.  Patient has appointment scheduled 07/07/25.  Refill sent to pharmacy.     Thersia, Charity fundraiser

## 2024-07-26 NOTE — Telephone Encounter (Signed)
 Patient called and said he has changed pharmacy ad [facility] Please assist

## 2024-07-29 ENCOUNTER — Encounter: Attending: Cardiovascular Disease

## 2024-08-03 MED ORDER — ROSUVASTATIN CALCIUM 20 MG PO TABS
20 | ORAL_TABLET | Freq: Every day | ORAL | 0 refills | 90.00000 days | Status: DC
Start: 2024-08-03 — End: 2024-11-01

## 2024-08-03 NOTE — Telephone Encounter (Signed)
"  Per protocol criteria met for refill. Refill sent per physician ordered protocol. See signed orders.    "

## 2024-08-03 NOTE — Telephone Encounter (Signed)
"  rosuvastatin  (CRESTOR ) 20 MG tablet, 90 day supply, 1 tablet per day    No changes in medication since last visit    LOV: 04/06/2024  NOV: 04/12/2025    [id number]    A Prescription for this was sent to [facility] earlier this year, however, [facility] is [facility] and [facility]. Is asking [facility]  "

## 2024-08-23 ENCOUNTER — Encounter

## 2024-09-02 NOTE — Telephone Encounter (Signed)
 Please advise

## 2024-09-06 MED ORDER — CELECOXIB 100 MG PO CAPS
100 | ORAL_CAPSULE | Freq: Two times a day (BID) | ORAL | 1 refills | 30.00000 days | Status: AC
Start: 2024-09-06 — End: ?

## 2024-09-30 ENCOUNTER — Encounter

## 2024-10-01 ENCOUNTER — Encounter

## 2024-11-01 MED ORDER — ROSUVASTATIN CALCIUM 20 MG PO TABS
20 | ORAL_TABLET | Freq: Every day | ORAL | 1 refills | 90.00000 days | Status: AC
Start: 2024-11-01 — End: ?

## 2024-11-01 NOTE — Telephone Encounter (Signed)
"  Rx pending  "
